# Patient Record
Sex: Male | Born: 1965 | Race: White | Hispanic: No | Marital: Married | Smoking: Never smoker
Health system: Southern US, Community
[De-identification: ages and names within clinical notes are randomized; demographics above are authoritative.]

## PROBLEM LIST (undated history)

## (undated) DIAGNOSIS — I10 Essential (primary) hypertension: Secondary | ICD-10-CM

## (undated) DIAGNOSIS — C187 Malignant neoplasm of sigmoid colon: Secondary | ICD-10-CM

## (undated) DIAGNOSIS — R7301 Impaired fasting glucose: Secondary | ICD-10-CM

## (undated) DIAGNOSIS — E785 Hyperlipidemia, unspecified: Secondary | ICD-10-CM

## (undated) DIAGNOSIS — M109 Gout, unspecified: Secondary | ICD-10-CM

## (undated) DIAGNOSIS — C801 Malignant (primary) neoplasm, unspecified: Secondary | ICD-10-CM

## (undated) HISTORY — PX: APPENDECTOMY: SHX54

## (undated) HISTORY — DX: Gout, unspecified: M10.9

## (undated) HISTORY — DX: Hyperlipidemia, unspecified: E78.5

## (undated) HISTORY — DX: Impaired fasting glucose: R73.01

## (undated) HISTORY — DX: Malignant neoplasm of sigmoid colon: C18.7

---

## 2012-10-04 ENCOUNTER — Telehealth: Payer: Self-pay | Admitting: Family Medicine

## 2012-10-04 NOTE — Telephone Encounter (Signed)
Patient requesting blood work orders, has follow up appt 10/17/12

## 2012-10-05 ENCOUNTER — Other Ambulatory Visit: Payer: Self-pay

## 2012-10-05 DIAGNOSIS — R7301 Impaired fasting glucose: Secondary | ICD-10-CM

## 2012-10-05 DIAGNOSIS — E785 Hyperlipidemia, unspecified: Secondary | ICD-10-CM

## 2012-10-05 DIAGNOSIS — Z79899 Other long term (current) drug therapy: Secondary | ICD-10-CM

## 2012-10-05 NOTE — Telephone Encounter (Signed)
Fasting lipid liver and glucose. Thanks

## 2012-10-05 NOTE — Telephone Encounter (Signed)
Left message on voicemail notifying patient that blood work has been submitted to [facility].

## 2012-10-10 ENCOUNTER — Other Ambulatory Visit: Payer: Self-pay | Admitting: *Deleted

## 2012-10-10 DIAGNOSIS — E785 Hyperlipidemia, unspecified: Secondary | ICD-10-CM

## 2012-10-10 DIAGNOSIS — Z79899 Other long term (current) drug therapy: Secondary | ICD-10-CM

## 2012-10-10 DIAGNOSIS — R7301 Impaired fasting glucose: Secondary | ICD-10-CM

## 2012-10-11 LAB — LIPID PANEL
HDL: 39 mg/dL — ABNORMAL LOW (ref >39)
Total CHOL/HDL Ratio: 4.5 Ratio
Triglycerides: 239 mg/dL — ABNORMAL HIGH (ref <150)

## 2012-10-11 LAB — HEPATIC FUNCTION PANEL
ALT: 36 U/L (ref 0–53)
AST: 23 U/L (ref 0–37)
Albumin: 4.4 g/dL (ref 3.5–5.2)
Alkaline Phosphatase: 71 U/L (ref 39–117)
Total Protein: 6.7 g/dL (ref 6.0–8.3)

## 2012-10-11 LAB — GLUCOSE, RANDOM: Glucose, Bld: 112 mg/dL — ABNORMAL HIGH (ref 70–99)

## 2012-10-12 ENCOUNTER — Encounter: Payer: Self-pay | Admitting: *Deleted

## 2012-10-17 ENCOUNTER — Ambulatory Visit (INDEPENDENT_AMBULATORY_CARE_PROVIDER_SITE_OTHER): Payer: BC Managed Care – PPO | Admitting: Family Medicine

## 2012-10-17 ENCOUNTER — Encounter: Payer: Self-pay | Admitting: Family Medicine

## 2012-10-17 VITALS — BP 118/86 | HR 80 | Wt 198.8 lb

## 2012-10-17 DIAGNOSIS — R7301 Impaired fasting glucose: Secondary | ICD-10-CM

## 2012-10-17 DIAGNOSIS — E785 Hyperlipidemia, unspecified: Secondary | ICD-10-CM

## 2012-10-17 MED ORDER — SIMVASTATIN 20 MG PO TABS: 20.0000 mg | ORAL_TABLET | Freq: Every evening | ORAL | Status: DC

## 2012-10-17 NOTE — Patient Instructions (Signed)
Try to cut down sugars in your diet.

## 2012-10-17 NOTE — Progress Notes (Signed)
  Subjective:    Patient ID: Scott Meyers, male    DOB: June 10, 1966, 47 y.o.   MRN: [id number]  Hyperlipidemia This is a chronic problem. The problem is controlled. Recent lipid tests were reviewed and are high. Exacerbating diseases include chronic renal disease. There are no known factors aggravating his hyperlipidemia. Pertinent negatives include no chest pain or focal weakness. Current antihyperlipidemic treatment includes exercise. The current treatment provides significant improvement of lipids. There are no compliance problems.     Also hx of imp fasting glu. Admits to eating high glu meals  Review of Systems  Cardiovascular: Negative for chest pain.  Neurological: Negative for focal weakness.  otherwise neg     Objective:   Physical Exam  Vitals reviewed. Constitutional: He appears well-developed and well-nourished.  HENT:  Head: Normocephalic and atraumatic.  Eyes: Pupils are equal, round, and reactive to light.  Neck: Normal range of motion. Neck supple.  Cardiovascular: Normal rate and regular rhythm.   Pulmonary/Chest: Effort normal.          Assessment & Plan:  Hyperlip. ldl good. trigly subopt--discussed. #2glu intol-disc Plan cst, work hard on diet and exercise

## 2013-01-04 ENCOUNTER — Encounter: Payer: Self-pay | Admitting: Family Medicine

## 2013-01-04 ENCOUNTER — Ambulatory Visit (INDEPENDENT_AMBULATORY_CARE_PROVIDER_SITE_OTHER): Payer: BC Managed Care – PPO | Admitting: Family Medicine

## 2013-01-04 VITALS — BP 130/98 | Temp 98.5°F | Wt 197.0 lb

## 2013-01-04 DIAGNOSIS — B351 Tinea unguium: Secondary | ICD-10-CM | POA: Insufficient documentation

## 2013-01-04 DIAGNOSIS — M779 Enthesopathy, unspecified: Secondary | ICD-10-CM

## 2013-01-04 MED ORDER — PREDNISONE 20 MG PO TABS: ORAL_TABLET | ORAL | Status: DC

## 2013-01-04 NOTE — Progress Notes (Signed)
  Subjective:    Patient ID: Scott Meyers, male    DOB: 31-Mar-1966, 47 y.o.   MRN: [id number]  HPI  Toe pain--prety severe after kicking dog. Turned blue with the injury. Now extremely painful. Particularly the top part of the toe. Prior medical history significant for gout.  Patient also concerned about probable fungus under great toenail on left side. No pain in the toe. It is breaking off at times. Review of Systems No pain elsewhere no chest pain no shortness of breath ROS otherwise negative.    Objective:   Physical Exam  Alert no acute distress. Lungs clear. Heart regular in rhythm. Right dorsal toe swollen exquisitely tender. No tenderness at first metatarsophalangeal joint. Significant fungal element under left great toenail.      Assessment & Plan:  Impression 1 first toe discussed. Nature of injury discussed. #2 onychomycosis. Recommend no therapy at this time. Potential lethal side effects of medication discussed. Local measures discussed. WSL prednisone taper.

## 2013-01-20 ENCOUNTER — Telehealth: Payer: Self-pay | Admitting: Family Medicine

## 2013-01-20 NOTE — Telephone Encounter (Signed)
May refill prednisone times one

## 2013-01-20 NOTE — Telephone Encounter (Signed)
Called in refill of prednisone to [facility]. Patient was notified.

## 2013-01-20 NOTE — Telephone Encounter (Signed)
Last office visit was 01/04/13 for issue

## 2013-01-20 NOTE — Telephone Encounter (Signed)
Patient needs another round of prednisone called in for his toe. He says its hurting again. He uses [facility].

## 2013-06-05 ENCOUNTER — Other Ambulatory Visit: Payer: Self-pay | Admitting: Family Medicine

## 2013-09-11 ENCOUNTER — Ambulatory Visit (INDEPENDENT_AMBULATORY_CARE_PROVIDER_SITE_OTHER): Payer: BC Managed Care – PPO | Admitting: Family Medicine

## 2013-09-11 ENCOUNTER — Encounter: Payer: Self-pay | Admitting: Family Medicine

## 2013-09-11 VITALS — BP 110/82 | Ht 68.0 in | Wt 206.2 lb

## 2013-09-11 DIAGNOSIS — Z79899 Other long term (current) drug therapy: Secondary | ICD-10-CM

## 2013-09-11 DIAGNOSIS — E785 Hyperlipidemia, unspecified: Secondary | ICD-10-CM

## 2013-09-11 DIAGNOSIS — Z0189 Encounter for other specified special examinations: Secondary | ICD-10-CM

## 2013-09-11 MED ORDER — SIMVASTATIN 20 MG PO TABS: 20.0000 mg | ORAL_TABLET | Freq: Every day | ORAL | Status: DC

## 2013-09-11 MED ORDER — PREDNISONE 20 MG PO TABS: ORAL_TABLET | ORAL | Status: DC

## 2013-09-11 NOTE — Progress Notes (Signed)
   Subjective:    Patient ID: Scott Meyers, male    DOB: 08-31-1965, 48 y.o.   MRN: [id number]  Toe Pain  The incident occurred 5 to 7 days ago. The injury mechanism was a direct blow. The pain is present in the right toes. The pain is at a severity of 9/10. The pain is moderate. The pain has been constant since onset. He reports no foreign bodies present. The symptoms are aggravated by movement. He has tried NSAIDs and non-weight bearing for the symptoms. The treatment provided mild relief.   Patient claims compliance with her lipid medication. Trying to watch his diet. Has cut the fats down his diet. Exercising a fair amount. Next.  History of elevated sugar. Has tried to cut down his diet.  The last time he injured his toe like this he required prednisone calm down the pain. Quite painful with motion. Took to heavy rock that was frozen in ground   Review of Systems Headache no chest pain no back pain no change in bowel habits ROS otherwise negative    Objective:   Physical Exam  Alert no acute distress. Vital stable. Lungs clear. Heart regular in rhythm. Right great toe tenderness warmth at base of the foot.      Assessment & Plan:  Impression 1 hyperlipidemia status uncertain #2 impaired fasting glucose. #3 "turf toe" plan prednisone taper. Check blood work. Diet exercise discussed. Check every 6 months. WSL

## 2013-10-05 ENCOUNTER — Other Ambulatory Visit: Payer: Self-pay | Admitting: Family Medicine

## 2013-10-05 NOTE — Telephone Encounter (Signed)
Pt seen on 09/11/13

## 2013-10-23 ENCOUNTER — Other Ambulatory Visit: Payer: Self-pay | Admitting: Family Medicine

## 2013-10-25 LAB — HEPATIC FUNCTION PANEL
ALBUMIN: 4 g/dL (ref 3.5–5.2)
ALT: 34 U/L (ref 0–53)
AST: 26 U/L (ref 0–37)
Alkaline Phosphatase: 67 U/L (ref 39–117)
Bilirubin, Direct: 0.2 mg/dL (ref 0.0–0.3)
Indirect Bilirubin: 0.7 mg/dL (ref 0.2–1.2)
TOTAL PROTEIN: 6.4 g/dL (ref 6.0–8.3)
Total Bilirubin: 0.9 mg/dL (ref 0.2–1.2)

## 2013-10-25 LAB — LIPID PANEL
CHOL/HDL RATIO: 4.2 ratio
CHOLESTEROL: 189 mg/dL (ref 0–200)
HDL: 45 mg/dL (ref >39)
LDL Cholesterol: 119 mg/dL — ABNORMAL HIGH (ref 0–99)
TRIGLYCERIDES: 123 mg/dL (ref <150)
VLDL: 25 mg/dL (ref 0–40)

## 2013-10-25 LAB — GLUCOSE, RANDOM: Glucose, Bld: 97 mg/dL (ref 70–99)

## 2013-10-31 ENCOUNTER — Encounter: Payer: Self-pay | Admitting: Family Medicine

## 2013-10-31 ENCOUNTER — Ambulatory Visit (INDEPENDENT_AMBULATORY_CARE_PROVIDER_SITE_OTHER): Payer: BC Managed Care – PPO | Admitting: Family Medicine

## 2013-10-31 VITALS — BP 142/90 | Ht 68.0 in | Wt 202.0 lb

## 2013-10-31 DIAGNOSIS — E785 Hyperlipidemia, unspecified: Secondary | ICD-10-CM

## 2013-10-31 DIAGNOSIS — R7301 Impaired fasting glucose: Secondary | ICD-10-CM

## 2013-10-31 MED ORDER — PREDNISONE 20 MG PO TABS: ORAL_TABLET | ORAL | Status: DC

## 2013-10-31 NOTE — Progress Notes (Signed)
   Subjective:    Patient ID: Scott Meyers, male    DOB: 04-08-1966, 48 y.o.   MRN: [id number]  HPI Patient is here today to go over BW results.  Pt said his foot/toe is doing better with the help of the prednisone.   Exercise ok but not great  Compliant with her lipid medication. Please see prior note. We had obtained his blood work. I had advised him out of a be able to check blood work not him come back in. However patient was told to come back in.  No chest pain no headache back pain Review of Systems No bile abdominal pain no change about habits no blood in stool ROS otherwise negative    Objective:   Physical Exam Alert no apparent distress. Lungs clear. Heart rare rhythm. H&T normal. Great toe less inflammation. Blood pressure good.       Assessment & Plan:  Impression 1 hyperlipidemia good control discussed. #2 recent contusion of foot question turf toe with all meds of gout. Plan refill prednisone this in case. Maintain other medications. Diet exercise discussed. WSL

## 2014-03-15 ENCOUNTER — Telehealth: Payer: Self-pay | Admitting: Family Medicine

## 2014-03-15 DIAGNOSIS — Z125 Encounter for screening for malignant neoplasm of prostate: Secondary | ICD-10-CM

## 2014-03-15 DIAGNOSIS — E785 Hyperlipidemia, unspecified: Secondary | ICD-10-CM

## 2014-03-15 DIAGNOSIS — Z79899 Other long term (current) drug therapy: Secondary | ICD-10-CM

## 2014-03-15 NOTE — Telephone Encounter (Signed)
Patient needs order for blood work. For PSA and cholestrol.

## 2014-03-15 NOTE — Telephone Encounter (Signed)
psa lip liv m7 

## 2014-03-15 NOTE — Telephone Encounter (Signed)
Lipid, liver and glucose done 4/15

## 2014-03-16 NOTE — Telephone Encounter (Signed)
Blood work orders placed in [facility]. Patient notified. 

## 2014-05-01 ENCOUNTER — Encounter: Payer: BC Managed Care – PPO | Admitting: Family Medicine

## 2014-05-02 ENCOUNTER — Ambulatory Visit: Payer: BC Managed Care – PPO | Admitting: Family Medicine

## 2014-05-12 ENCOUNTER — Other Ambulatory Visit: Payer: Self-pay | Admitting: Family Medicine

## 2014-05-13 LAB — HEPATIC FUNCTION PANEL
ALT: 52 U/L (ref 0–53)
AST: 31 U/L (ref 0–37)
Albumin: 4.2 g/dL (ref 3.5–5.2)
Alkaline Phosphatase: 70 U/L (ref 39–117)
BILIRUBIN DIRECT: 0.1 mg/dL (ref 0.0–0.3)
Indirect Bilirubin: 0.7 mg/dL (ref 0.2–1.2)
TOTAL PROTEIN: 6.6 g/dL (ref 6.0–8.3)
Total Bilirubin: 0.8 mg/dL (ref 0.2–1.2)

## 2014-05-13 LAB — BASIC METABOLIC PANEL
BUN: 12 mg/dL (ref 6–23)
CHLORIDE: 104 meq/L (ref 96–112)
CO2: 26 mEq/L (ref 19–32)
Calcium: 9 mg/dL (ref 8.4–10.5)
Creat: 1 mg/dL (ref 0.50–1.35)
GLUCOSE: 99 mg/dL (ref 70–99)
POTASSIUM: 4.4 meq/L (ref 3.5–5.3)
SODIUM: 140 meq/L (ref 135–145)

## 2014-05-13 LAB — LIPID PANEL
CHOL/HDL RATIO: 4.8 ratio
CHOLESTEROL: 216 mg/dL — AB (ref 0–200)
HDL: 45 mg/dL (ref >39)
LDL CALC: 145 mg/dL — AB (ref 0–99)
Triglycerides: 128 mg/dL (ref <150)
VLDL: 26 mg/dL (ref 0–40)

## 2014-05-14 ENCOUNTER — Encounter: Payer: Self-pay | Admitting: Family Medicine

## 2014-05-14 ENCOUNTER — Ambulatory Visit (INDEPENDENT_AMBULATORY_CARE_PROVIDER_SITE_OTHER): Payer: BC Managed Care – PPO | Admitting: Family Medicine

## 2014-05-14 VITALS — BP 138/94 | Ht 68.0 in | Wt 196.0 lb

## 2014-05-14 DIAGNOSIS — Z0000 Encounter for general adult medical examination without abnormal findings: Secondary | ICD-10-CM

## 2014-05-14 LAB — PSA: PSA: 1.11 ng/mL (ref <=4.00)

## 2014-05-14 MED ORDER — SIMVASTATIN 20 MG PO TABS: 20.0000 mg | ORAL_TABLET | Freq: Every day | ORAL | Status: DC

## 2014-05-14 NOTE — Progress Notes (Signed)
Subjective:    Patient ID: Scott Meyers, male    DOB: April 23, 1966, 48 y.o.   MRN: [id number]  HPI The patient comes in today for a wellness visit.  A review of their health history was completed.  A review of medications was also completed.  Any needed refills: YES  Eating habits: Health conscious  Falls/  MVA accidents in past few months: NO  Regular exercise: Walking  Specialist pt sees on regular basis: NO  Preventative health issues were discussed.   Additional concerns: Would like to go over blood work.   States watching diet and eating better   Results for orders placed or performed in visit on 37/04/88  Basic metabolic panel  Result Value Ref Range   Sodium 140 135 - 145 mEq/L   Potassium 4.4 3.5 - 5.3 mEq/L   Chloride 104 96 - 112 mEq/L   CO2 26 19 - 32 mEq/L   Glucose, Bld 99 70 - 99 mg/dL   BUN 12 6 - 23 mg/dL   Creat 1.00 0.50 - 1.35 mg/dL   Calcium 9.0 8.4 - 10.5 mg/dL  Lipid panel  Result Value Ref Range   Cholesterol 216 (H) 0 - 200 mg/dL   Triglycerides 128 <150 mg/dL   HDL 45 >39 mg/dL   Total CHOL/HDL Ratio 4.8 Ratio   VLDL 26 0 - 40 mg/dL   LDL Cholesterol 145 (H) 0 - 99 mg/dL  Hepatic function panel  Result Value Ref Range   Total Bilirubin 0.8 0.2 - 1.2 mg/dL   Bilirubin, Direct 0.1 0.0 - 0.3 mg/dL   Indirect Bilirubin 0.7 0.2 - 1.2 mg/dL   Alkaline Phosphatase 70 39 - 117 U/L   AST 31 0 - 37 U/L   ALT 52 0 - 53 U/L   Total Protein 6.6 6.0 - 8.3 g/dL   Albumin 4.2 3.5 - 5.2 g/dL  PSA  Result Value Ref Range   PSA  <=4.00 ng/mL  trying to eat better but some baked and fried foods   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  HENT: Negative for congestion and rhinorrhea.   Eyes: Negative for discharge.  Respiratory: Negative for cough and wheezing.   Cardiovascular: Negative for chest pain.    Gastrointestinal: Negative for vomiting, abdominal pain and blood in stool.  Genitourinary: Negative for frequency and difficulty urinating.  Musculoskeletal: Negative for neck pain.  Skin: Negative for rash.  Allergic/Immunologic: Negative for environmental allergies and food allergies.  Neurological: Negative for weakness and headaches.  Psychiatric/Behavioral: Negative for agitation.  All other systems reviewed and are negative.      Objective:   Physical Exam  Constitutional: He appears well-developed and well-nourished.  HENT:  Head: Normocephalic and atraumatic.  Right Ear: External ear normal.  Left Ear: External ear normal.  Nose: Nose normal.  Mouth/Throat: Oropharynx is clear and moist.  Eyes: EOM are normal. Pupils are equal, round, and reactive to light.  Neck: Normal range of motion. Neck supple. No thyromegaly present.  Cardiovascular: Normal rate, regular rhythm and normal heart sounds.   No murmur heard. Pulmonary/Chest: Effort normal and breath sounds normal. No respiratory distress. He has no wheezes.  Abdominal: Soft. Bowel sounds are normal. He exhibits no distension and no mass. There is no tenderness.  Genitourinary: Prostate normal and penis normal.  Musculoskeletal:  Normal range of motion. He exhibits no edema.  Lymphadenopathy:    He has no cervical adenopathy.  Neurological: He is alert. He exhibits normal muscle tone.  Skin: Skin is warm and dry. No erythema.  Psychiatric: He has a normal mood and affect. His behavior is normal. Judgment normal.          Assessment & Plan:  Impression 1 wellness exam #2 hyperlipidemia so off to mom but with suboptimal compliance both with medications and diet plan diet exercise discussed. Declines flu shot. Check in 6 months. WSL

## 2014-05-20 ENCOUNTER — Encounter: Payer: Self-pay | Admitting: Family Medicine

## 2014-07-11 ENCOUNTER — Other Ambulatory Visit: Payer: Self-pay | Admitting: Family Medicine

## 2014-07-11 MED ORDER — PREDNISONE 20 MG PO TABS: ORAL_TABLET | ORAL | Status: DC

## 2014-07-11 NOTE — Telephone Encounter (Signed)
Notified patient medication has been sent to pharmacy.

## 2014-07-11 NOTE — Addendum Note (Signed)
Addended by: Jesusita Oka on: 07/11/2014 03:38 PM   Modules accepted: Orders

## 2014-07-11 NOTE — Telephone Encounter (Signed)
Patient calling for refill on prednisone for swollen foot.He states couldn't sleep last night so swollen to the touch. Call into [facility].

## 2014-07-11 NOTE — Telephone Encounter (Signed)
Adult pred taper 

## 2014-11-05 ENCOUNTER — Telehealth: Payer: Self-pay | Admitting: Family Medicine

## 2014-11-05 DIAGNOSIS — Z79899 Other long term (current) drug therapy: Secondary | ICD-10-CM

## 2014-11-05 DIAGNOSIS — E785 Hyperlipidemia, unspecified: Secondary | ICD-10-CM

## 2014-11-05 DIAGNOSIS — R7301 Impaired fasting glucose: Secondary | ICD-10-CM

## 2014-11-05 NOTE — Telephone Encounter (Signed)
Pt is requesting lab orders to be sent over for his 5/2 appt. Last labs per epic were: BMP,LIPID,HEPATIC, AND PSA on 05/12/14.

## 2014-11-06 NOTE — Telephone Encounter (Signed)
bw orders ready. Pt notified. 

## 2014-11-06 NOTE — Telephone Encounter (Signed)
Lip liv glu 

## 2014-11-10 LAB — LIPID PANEL
CHOLESTEROL TOTAL: 199 mg/dL (ref 100–199)
Chol/HDL Ratio: 4.1 ratio units (ref 0.0–5.0)
HDL: 49 mg/dL (ref >39)
LDL Calculated: 124 mg/dL — ABNORMAL HIGH (ref 0–99)
TRIGLYCERIDES: 132 mg/dL (ref 0–149)
VLDL CHOLESTEROL CAL: 26 mg/dL (ref 5–40)

## 2014-11-10 LAB — HEPATIC FUNCTION PANEL
ALK PHOS: 78 IU/L (ref 39–117)
ALT: 45 IU/L — ABNORMAL HIGH (ref 0–44)
AST: 32 IU/L (ref 0–40)
Albumin: 4.5 g/dL (ref 3.5–5.5)
BILIRUBIN TOTAL: 0.8 mg/dL (ref 0.0–1.2)
Bilirubin, Direct: 0.19 mg/dL (ref 0.00–0.40)
TOTAL PROTEIN: 6.6 g/dL (ref 6.0–8.5)

## 2014-11-10 LAB — GLUCOSE, RANDOM: Glucose: 112 mg/dL — ABNORMAL HIGH (ref 65–99)

## 2014-11-12 ENCOUNTER — Ambulatory Visit (INDEPENDENT_AMBULATORY_CARE_PROVIDER_SITE_OTHER): Payer: BC Managed Care – PPO | Admitting: Family Medicine

## 2014-11-12 ENCOUNTER — Encounter: Payer: Self-pay | Admitting: Family Medicine

## 2014-11-12 VITALS — BP 138/98 | Ht 68.0 in | Wt 194.0 lb

## 2014-11-12 DIAGNOSIS — R739 Hyperglycemia, unspecified: Secondary | ICD-10-CM

## 2014-11-12 DIAGNOSIS — R7301 Impaired fasting glucose: Secondary | ICD-10-CM | POA: Diagnosis not present

## 2014-11-12 DIAGNOSIS — E785 Hyperlipidemia, unspecified: Secondary | ICD-10-CM

## 2014-11-12 LAB — POCT GLYCOSYLATED HEMOGLOBIN (HGB A1C): Hemoglobin A1C: 5.6

## 2014-11-12 MED ORDER — SIMVASTATIN 20 MG PO TABS: 20.0000 mg | ORAL_TABLET | Freq: Every day | ORAL | Status: DC

## 2014-11-12 NOTE — Progress Notes (Signed)
   Subjective:    Patient ID: Scott Meyers, male    DOB: [birth year], 49 y.o.   MRN: [id number]  Hyperlipidemia This is a chronic problem. Treatments tried: pravastatin. There are no compliance problems.   pt walks some and he is eating healthier.    Pt had bloodwork done 4/29. Glucose was 112. A1C done today. 5.6  Needs 90 day supply on pravastatin.   Results for orders placed or performed in visit on 11/12/14  POCT glycosylated hemoglobin (Hb A1C)  Result Value Ref Range   Hemoglobin A1C 5.6    Patient has no close family histories with diabetes. Trying to watch his own diet. Unfortunately not exercising other than occasional walking  Review of Systems No headache no chest pain no back pain no abdominal pain    Objective:   Physical Exam Alert vitals stable blood pressure good on repeat H&T normal. Lungs clear heart regular in rhythm.       Assessment & Plan:  Impression 1 hyperlipidemia good control #2 impaired fasting glucose discussed plan work on diet. Exercise diet discussed. Maintain same medications. Check in 6 months. Wellness exam then. WSL

## 2015-03-15 ENCOUNTER — Telehealth: Payer: Self-pay | Admitting: Family Medicine

## 2015-03-15 DIAGNOSIS — Z125 Encounter for screening for malignant neoplasm of prostate: Secondary | ICD-10-CM

## 2015-03-15 DIAGNOSIS — E785 Hyperlipidemia, unspecified: Secondary | ICD-10-CM

## 2015-03-15 DIAGNOSIS — Z79899 Other long term (current) drug therapy: Secondary | ICD-10-CM

## 2015-03-15 NOTE — Telephone Encounter (Signed)
Pt has appt coming needs bw orders please, call when sent   Last labs  Lip, Hep, Glucose random

## 2015-03-15 NOTE — Telephone Encounter (Signed)
Lip liv m7 psa 

## 2015-03-15 NOTE — Telephone Encounter (Signed)
bw orders ready. Pt notified. 

## 2015-05-10 ENCOUNTER — Encounter: Payer: BC Managed Care – PPO | Admitting: Family Medicine

## 2015-05-13 LAB — BASIC METABOLIC PANEL
BUN/Creatinine Ratio: 9 (ref 9–20)
BUN: 10 mg/dL (ref 6–24)
CHLORIDE: 99 mmol/L (ref 97–106)
CO2: 24 mmol/L (ref 18–29)
Calcium: 9.3 mg/dL (ref 8.7–10.2)
Creatinine, Ser: 1.12 mg/dL (ref 0.76–1.27)
GFR calc Af Amer: 89 mL/min/{1.73_m2} (ref >59)
GFR calc non Af Amer: 77 mL/min/{1.73_m2} (ref >59)
GLUCOSE: 111 mg/dL — AB (ref 65–99)
POTASSIUM: 4.8 mmol/L (ref 3.5–5.2)
Sodium: 143 mmol/L (ref 136–144)

## 2015-05-13 LAB — HEPATIC FUNCTION PANEL
ALK PHOS: 79 IU/L (ref 39–117)
ALT: 68 IU/L — AB (ref 0–44)
AST: 36 IU/L (ref 0–40)
Albumin: 4.4 g/dL (ref 3.5–5.5)
Bilirubin Total: 0.7 mg/dL (ref 0.0–1.2)
Bilirubin, Direct: 0.2 mg/dL (ref 0.00–0.40)
TOTAL PROTEIN: 6.5 g/dL (ref 6.0–8.5)

## 2015-05-13 LAB — LIPID PANEL
Chol/HDL Ratio: 4.7 ratio units (ref 0.0–5.0)
Cholesterol, Total: 213 mg/dL — ABNORMAL HIGH (ref 100–199)
HDL: 45 mg/dL (ref >39)
LDL Calculated: 136 mg/dL — ABNORMAL HIGH (ref 0–99)
TRIGLYCERIDES: 158 mg/dL — AB (ref 0–149)
VLDL CHOLESTEROL CAL: 32 mg/dL (ref 5–40)

## 2015-05-13 LAB — PSA: Prostate Specific Ag, Serum: 1.3 ng/mL (ref 0.0–4.0)

## 2015-05-24 ENCOUNTER — Ambulatory Visit (INDEPENDENT_AMBULATORY_CARE_PROVIDER_SITE_OTHER): Payer: BC Managed Care – PPO | Admitting: Family Medicine

## 2015-05-24 ENCOUNTER — Encounter: Payer: Self-pay | Admitting: Family Medicine

## 2015-05-24 VITALS — BP 132/90 | Ht 68.0 in | Wt 200.2 lb

## 2015-05-24 DIAGNOSIS — R7301 Impaired fasting glucose: Secondary | ICD-10-CM

## 2015-05-24 DIAGNOSIS — E785 Hyperlipidemia, unspecified: Secondary | ICD-10-CM | POA: Diagnosis not present

## 2015-05-24 DIAGNOSIS — Z0000 Encounter for general adult medical examination without abnormal findings: Secondary | ICD-10-CM

## 2015-05-24 MED ORDER — SIMVASTATIN 20 MG PO TABS: 20.0000 mg | ORAL_TABLET | Freq: Every day | ORAL | Status: DC

## 2015-05-24 NOTE — Progress Notes (Signed)
Subjective:    Patient ID: Scott Meyers, male    DOB: Aug 19, 1965, 49 y.o.   MRN: [id number]  HPI The patient comes in today for a wellness visit.    A review of their health history was completed.  A review of medications was also completed.  Any needed refills; yes  Eating habits: Patient states trying to cut back on carbohydrates. Tries to eat well balanced.  Falls/  MVA accidents in past few months: None  Regular exercise: Patient states he walks several times a week. States he walks at slow pace.  Specialist pt sees on regular basis: None  Preventative health issues were discussed.   Additional concerns: Patient states no concerns this visit.  Results for orders placed or performed in visit on 03/15/15  Lipid panel  Result Value Ref Range   Cholesterol, Total 213 (H) 100 - 199 mg/dL   Triglycerides 158 (H) 0 - 149 mg/dL   HDL 45 >39 mg/dL   VLDL Cholesterol Cal 32 5 - 40 mg/dL   LDL Calculated 136 (H) 0 - 99 mg/dL   Chol/HDL Ratio 4.7 0.0 - 5.0 ratio units  Hepatic function panel  Result Value Ref Range   Total Protein 6.5 6.0 - 8.5 g/dL   Albumin 4.4 3.5 - 5.5 g/dL   Bilirubin Total 0.7 0.0 - 1.2 mg/dL   Bilirubin, Direct 0.20 0.00 - 0.40 mg/dL   Alkaline Phosphatase 79 39 - 117 IU/L   AST 36 0 - 40 IU/L   ALT 68 (H) 0 - 44 IU/L  Basic metabolic panel  Result Value Ref Range   Glucose 111 (H) 65 - 99 mg/dL   BUN 10 6 - 24 mg/dL   Creatinine, Ser 1.12 0.76 - 1.27 mg/dL   GFR calc non Af Amer 77 >59 mL/min/1.73   GFR calc Af Amer 89 >59 mL/min/1.73   BUN/Creatinine Ratio 9 9 - 20   Sodium 143 136 - 144 mmol/L   Potassium 4.8 3.5 - 5.2 mmol/L   Chloride 99 97 - 106 mmol/L   CO2 24 18 - 29 mmol/L   Calcium 9.3 8.7 - 10.2 mg/dL  PSA  Result Value Ref Range   Prostate Specific Ag, Serum 1.3 0.0 - 4.0 ng/mL    Pt had liver  c a hx with the family  Pt states diet overall i  Patient compliant with cholesterol medication. No obvious side effects.  Medications reviewed today. Watching fat intake overall.    Review of Systems  Constitutional: Negative for fever, activity change and appetite change.  HENT: Negative for congestion and rhinorrhea.   Eyes: Negative for discharge.  Respiratory: Negative for cough and wheezing.   Cardiovascular: Negative for chest pain.  Gastrointestinal: Negative for vomiting, abdominal pain and blood in stool.  Genitourinary: Negative for frequency and difficulty urinating.  Musculoskeletal: Negative for neck pain.  Skin: Negative for rash.  Allergic/Immunologic: Negative for environmental allergies and food allergies.  Neurological: Negative for weakness and headaches.  Psychiatric/Behavioral: Negative for agitation.  All other systems reviewed and are negative.      Objective:   Physical Exam  Constitutional: He appears well-developed and well-nourished.  HENT:  Head: Normocephalic and atraumatic.  Right Ear: External ear normal.  Left Ear: External ear normal.  Nose: Nose normal.  Mouth/Throat: Oropharynx is clear and moist.  Eyes: EOM are normal. Pupils are equal, round, and reactive to light.  Neck: Normal range of motion. Neck supple. No thyromegaly present.  Cardiovascular: Normal  rate, regular rhythm and normal heart sounds.   No murmur heard. Pulmonary/Chest: Effort normal and breath sounds normal. No respiratory distress. He has no wheezes.  Abdominal: Soft. Bowel sounds are normal. He exhibits no distension and no mass. There is no tenderness.  Genitourinary: Penis normal.  Musculoskeletal: Normal range of motion. He exhibits no edema.  Lymphadenopathy:    He has no cervical adenopathy.  Neurological: He is alert. He exhibits normal muscle tone.  Skin: Skin is warm and dry. No erythema.  Psychiatric: He has a normal mood and affect. His behavior is normal. Judgment normal.  Vitals reviewed.         Assessment & Plan:  Impression 1 wellness exam #2 hyperlipidemia discuss  medications reviewed diet. #3 impaired fasting glucose discussed plan maintain same meds meds refilled diet exercise discussed blood work reviewed follow-up in 6 months WSL

## 2015-09-20 ENCOUNTER — Encounter: Payer: Self-pay | Admitting: Podiatry

## 2015-09-20 ENCOUNTER — Ambulatory Visit (INDEPENDENT_AMBULATORY_CARE_PROVIDER_SITE_OTHER): Payer: BC Managed Care – PPO | Admitting: Podiatry

## 2015-09-20 ENCOUNTER — Ambulatory Visit (INDEPENDENT_AMBULATORY_CARE_PROVIDER_SITE_OTHER): Payer: BC Managed Care – PPO

## 2015-09-20 VITALS — BP 152/101 | HR 70 | Resp 16 | Ht 68.0 in | Wt 195.0 lb

## 2015-09-20 DIAGNOSIS — M79672 Pain in left foot: Secondary | ICD-10-CM | POA: Diagnosis not present

## 2015-09-20 DIAGNOSIS — M722 Plantar fascial fibromatosis: Secondary | ICD-10-CM | POA: Diagnosis not present

## 2015-09-20 MED ORDER — DICLOFENAC SODIUM 75 MG PO TBEC: 75.0000 mg | DELAYED_RELEASE_TABLET | Freq: Two times a day (BID) | ORAL | Status: DC

## 2015-09-20 MED ORDER — TRIAMCINOLONE ACETONIDE 10 MG/ML IJ SUSP
10.0000 mg | Freq: Once | INTRAMUSCULAR | Status: AC
  Administered 2015-09-20: 10 mg

## 2015-09-20 NOTE — Progress Notes (Signed)
   Subjective:    Patient ID: Scott Meyers, male    DOB: 09-21-1965, 50 y.o.   MRN: [id number]  HPI Patient presents with foot pain in their left foot; heel. Pt stated, "foot hurts more in the morning"; x1 month.  Review of Systems  All other systems reviewed and are negative.      Objective:   Physical Exam        Assessment & Plan:

## 2015-09-20 NOTE — Patient Instructions (Signed)

## 2015-09-20 NOTE — Progress Notes (Signed)
Subjective:     Patient ID: Scott Meyers, male   DOB: 10/20/1965, 50 y.o.   MRN: [id number]  HPI patient presents stating I'm having a lot of pain in the bottom of my left heel and it's making walking difficult. It's been very sharp and is been worse for the last month   Review of Systems  All other systems reviewed and are negative.      Objective:   Physical Exam  Constitutional: He is oriented to person, place, and time.  Cardiovascular: Intact distal pulses.   Musculoskeletal: Normal range of motion.  Neurological: He is oriented to person, place, and time.  Skin: Skin is warm.  Nursing note and vitals reviewed.  neurovascular status intact muscle strength adequate range of motion within normal limits with patient found to have exquisite discomfort in the left plantar heel at the insertional point of the tendon into the calcaneus with fluid buildup around the medial band. Patient is found to have moderate depression of the arch good digital perfusion and is well oriented 3     Assessment:     Acute plantar fasciitis left with fluid buildup noted    Plan:     H&P and x-rays reviewed with patient. Today I injected the left plantar fascia 3 mg Kenalog 5 mg Xylocaine and gave extensive instructions on physical therapy shoe gear modifications and not going barefoot and dispensed fascial brace with instructions on usage. Reappoint to recheck again in 2 weeks  X-ray report indicated spur formation with no indications of stress fracture or arthritis

## 2015-10-04 ENCOUNTER — Ambulatory Visit (INDEPENDENT_AMBULATORY_CARE_PROVIDER_SITE_OTHER): Payer: BC Managed Care – PPO | Admitting: Podiatry

## 2015-10-04 ENCOUNTER — Encounter: Payer: Self-pay | Admitting: Podiatry

## 2015-10-04 VITALS — BP 152/101 | HR 68 | Resp 16

## 2015-10-04 DIAGNOSIS — M722 Plantar fascial fibromatosis: Secondary | ICD-10-CM | POA: Diagnosis not present

## 2015-10-06 NOTE — Progress Notes (Signed)
Subjective:     Patient ID: Scott Meyers, male   DOB: [birth year], 50 y.o.   MRN: [id number]  HPI patient states my foot is feeling much better with minimal discomfort and only pain if him on it a lot   Review of Systems     Objective:   Physical Exam Neurovascular status intact muscle strength adequate range of motion within normal limits with patient found to have discomfort upon deep palpation plantar fascial left    Assessment:     Plantar fasciitis which is improving significantly left    Plan:     Reviewed conditions and recommended continued physical therapy supportive shoe gear usage and stretching. If symptoms persist or get worse we will consider orthotics or other treatment

## 2015-11-01 ENCOUNTER — Telehealth: Payer: Self-pay | Admitting: Family Medicine

## 2015-11-01 DIAGNOSIS — Z131 Encounter for screening for diabetes mellitus: Secondary | ICD-10-CM

## 2015-11-01 DIAGNOSIS — Z79899 Other long term (current) drug therapy: Secondary | ICD-10-CM

## 2015-11-01 DIAGNOSIS — E785 Hyperlipidemia, unspecified: Secondary | ICD-10-CM

## 2015-11-01 NOTE — Telephone Encounter (Signed)
Pt is requesting lab orders to be sent over for an upcoming appt. Last labs per epic were: lipid,hepatic,bmp,and psa on 05/11/15

## 2015-11-03 NOTE — Telephone Encounter (Signed)
Lip liv glu 

## 2015-11-04 NOTE — Telephone Encounter (Signed)
Notified patient bloodwork has been ordered.  

## 2015-11-10 LAB — GLUCOSE, RANDOM: GLUCOSE: 111 mg/dL — AB (ref 65–99)

## 2015-11-10 LAB — HEPATIC FUNCTION PANEL
ALT: 58 IU/L — ABNORMAL HIGH (ref 0–44)
AST: 35 IU/L (ref 0–40)
Albumin: 4.6 g/dL (ref 3.5–5.5)
Alkaline Phosphatase: 78 IU/L (ref 39–117)
BILIRUBIN TOTAL: 0.8 mg/dL (ref 0.0–1.2)
Bilirubin, Direct: 0.2 mg/dL (ref 0.00–0.40)
TOTAL PROTEIN: 7.1 g/dL (ref 6.0–8.5)

## 2015-11-10 LAB — LIPID PANEL
CHOLESTEROL TOTAL: 231 mg/dL — AB (ref 100–199)
Chol/HDL Ratio: 4.4 ratio units (ref 0.0–5.0)
HDL: 52 mg/dL (ref >39)
LDL Calculated: 151 mg/dL — ABNORMAL HIGH (ref 0–99)
Triglycerides: 138 mg/dL (ref 0–149)
VLDL Cholesterol Cal: 28 mg/dL (ref 5–40)

## 2015-11-22 ENCOUNTER — Ambulatory Visit (INDEPENDENT_AMBULATORY_CARE_PROVIDER_SITE_OTHER): Payer: BC Managed Care – PPO | Admitting: Family Medicine

## 2015-11-22 ENCOUNTER — Encounter: Payer: Self-pay | Admitting: Family Medicine

## 2015-11-22 VITALS — BP 122/70 | Ht 68.0 in | Wt 203.0 lb

## 2015-11-22 DIAGNOSIS — E785 Hyperlipidemia, unspecified: Secondary | ICD-10-CM

## 2015-11-22 DIAGNOSIS — R7301 Impaired fasting glucose: Secondary | ICD-10-CM

## 2015-11-22 MED ORDER — SIMVASTATIN 20 MG PO TABS: 20.0000 mg | ORAL_TABLET | Freq: Every day | ORAL | Status: DC

## 2015-11-22 NOTE — Progress Notes (Signed)
   Subjective:    Patient ID: Scott Meyers, male    DOB: 01-May-1966, 50 y.o.   MRN: [id number]  Hyperlipidemia This is a chronic problem. The current episode started more than 1 year ago. Treatments tried: simvastatin. Compliance problems: sometimes forgets to take med, does some exercise, walks and rides bike, does not eat healthy.    Results for orders placed or performed in visit on 11/01/15  Lipid panel  Result Value Ref Range   Cholesterol, Total 231 (H) 100 - 199 mg/dL   Triglycerides 138 0 - 149 mg/dL   HDL 52 >39 mg/dL   VLDL Cholesterol Cal 28 5 - 40 mg/dL   LDL Calculated 151 (H) 0 - 99 mg/dL   Chol/HDL Ratio 4.4 0.0 - 5.0 ratio units  Hepatic function panel  Result Value Ref Range   Total Protein 7.1 6.0 - 8.5 g/dL   Albumin 4.6 3.5 - 5.5 g/dL   Bilirubin Total 0.8 0.0 - 1.2 mg/dL   Bilirubin, Direct 0.20 0.00 - 0.40 mg/dL   Alkaline Phosphatase 78 39 - 117 IU/L   AST 35 0 - 40 IU/L   ALT 58 (H) 0 - 44 IU/L  Glucose, random  Result Value Ref Range   Glucose 111 (H) 65 - 99 mg/dL   Patient admits to some dietary noncompliance. Also missing medications of time. Has started exercising more   Pt states no concerns or problems.    Review of Systems No headache, no major weight loss or weight gain, no chest pain no back pain abdominal pain no change in bowel habits complete ROS otherwise negative     Objective:   Physical Exam Alert no apparent distress vital stable HEENT normal lungs clear. Heart regular in rhythm   impression hyperlipidemia suboptimum control but decent discussed #2 impaired fasting glucose ongoing concerns discussed plan medications refilled diet exercise discussed check in 6 months for wellness plus chronic follow-up       Assessment & Plan:

## 2016-05-12 ENCOUNTER — Telehealth: Payer: Self-pay | Admitting: Family Medicine

## 2016-05-12 DIAGNOSIS — Z125 Encounter for screening for malignant neoplasm of prostate: Secondary | ICD-10-CM

## 2016-05-12 DIAGNOSIS — E785 Hyperlipidemia, unspecified: Secondary | ICD-10-CM

## 2016-05-12 DIAGNOSIS — Z79899 Other long term (current) drug therapy: Secondary | ICD-10-CM

## 2016-05-12 NOTE — Telephone Encounter (Signed)
Requesting order for blood work for appointment on 05/29/16.

## 2016-05-12 NOTE — Telephone Encounter (Signed)
bloodwork orders ready. Pt notified.  

## 2016-05-12 NOTE — Telephone Encounter (Signed)
Lip liv m7 psa 

## 2016-05-14 ENCOUNTER — Other Ambulatory Visit: Payer: Self-pay | Admitting: Family Medicine

## 2016-05-14 MED ORDER — SIMVASTATIN 20 MG PO TABS: 20.0000 mg | ORAL_TABLET | Freq: Every day | ORAL | 0 refills | Status: DC

## 2016-05-14 NOTE — Addendum Note (Signed)
Addended by: Ofilia Neas R on: 05/14/2016 10:11 AM   Modules accepted: Orders

## 2016-05-15 ENCOUNTER — Other Ambulatory Visit: Payer: Self-pay | Admitting: Family Medicine

## 2016-05-22 LAB — LIPID PANEL
CHOL/HDL RATIO: 3.8 ratio (ref 0.0–5.0)
Cholesterol, Total: 194 mg/dL (ref 100–199)
HDL: 51 mg/dL (ref >39)
LDL Calculated: 107 mg/dL — ABNORMAL HIGH (ref 0–99)
TRIGLYCERIDES: 179 mg/dL — AB (ref 0–149)
VLDL Cholesterol Cal: 36 mg/dL (ref 5–40)

## 2016-05-22 LAB — HEPATIC FUNCTION PANEL
ALK PHOS: 79 IU/L (ref 39–117)
ALT: 65 IU/L — ABNORMAL HIGH (ref 0–44)
AST: 34 IU/L (ref 0–40)
Albumin: 4.4 g/dL (ref 3.5–5.5)
BILIRUBIN TOTAL: 0.7 mg/dL (ref 0.0–1.2)
BILIRUBIN, DIRECT: 0.17 mg/dL (ref 0.00–0.40)
TOTAL PROTEIN: 7 g/dL (ref 6.0–8.5)

## 2016-05-22 LAB — BASIC METABOLIC PANEL
BUN / CREAT RATIO: 10 (ref 9–20)
BUN: 12 mg/dL (ref 6–24)
CO2: 25 mmol/L (ref 18–29)
CREATININE: 1.2 mg/dL (ref 0.76–1.27)
Calcium: 9.1 mg/dL (ref 8.7–10.2)
Chloride: 99 mmol/L (ref 96–106)
GFR, EST AFRICAN AMERICAN: 81 mL/min/{1.73_m2} (ref >59)
GFR, EST NON AFRICAN AMERICAN: 70 mL/min/{1.73_m2} (ref >59)
Glucose: 109 mg/dL — ABNORMAL HIGH (ref 65–99)
Potassium: 4.6 mmol/L (ref 3.5–5.2)
SODIUM: 141 mmol/L (ref 134–144)

## 2016-05-22 LAB — PSA: Prostate Specific Ag, Serum: 1.3 ng/mL (ref 0.0–4.0)

## 2016-05-29 ENCOUNTER — Encounter: Payer: Self-pay | Admitting: Family Medicine

## 2016-05-29 ENCOUNTER — Ambulatory Visit (INDEPENDENT_AMBULATORY_CARE_PROVIDER_SITE_OTHER): Payer: BC Managed Care – PPO | Admitting: Family Medicine

## 2016-05-29 VITALS — BP 124/90 | Ht 67.75 in | Wt 197.0 lb

## 2016-05-29 DIAGNOSIS — M25552 Pain in left hip: Secondary | ICD-10-CM | POA: Diagnosis not present

## 2016-05-29 DIAGNOSIS — Z0000 Encounter for general adult medical examination without abnormal findings: Secondary | ICD-10-CM | POA: Diagnosis not present

## 2016-05-29 DIAGNOSIS — E785 Hyperlipidemia, unspecified: Secondary | ICD-10-CM | POA: Diagnosis not present

## 2016-05-29 DIAGNOSIS — M545 Low back pain: Secondary | ICD-10-CM | POA: Diagnosis not present

## 2016-05-29 MED ORDER — SIMVASTATIN 20 MG PO TABS: 20.0000 mg | ORAL_TABLET | Freq: Every day | ORAL | 5 refills | Status: DC

## 2016-05-29 NOTE — Patient Instructions (Signed)
Results for orders placed or performed in visit on 05/12/16  Lipid panel  Result Value Ref Range   Cholesterol, Total 194 100 - 199 mg/dL   Triglycerides 179 (H) 0 - 149 mg/dL   HDL 51 >39 mg/dL   VLDL Cholesterol Cal 36 5 - 40 mg/dL   LDL Calculated 107 (H) 0 - 99 mg/dL   Chol/HDL Ratio 3.8 0.0 - 5.0 ratio units  Hepatic function panel  Result Value Ref Range   Total Protein 7.0 6.0 - 8.5 g/dL   Albumin 4.4 3.5 - 5.5 g/dL   Bilirubin Total 0.7 0.0 - 1.2 mg/dL   Bilirubin, Direct 0.17 0.00 - 0.40 mg/dL   Alkaline Phosphatase 79 39 - 117 IU/L   AST 34 0 - 40 IU/L   ALT 65 (H) 0 - 44 IU/L  Basic metabolic panel  Result Value Ref Range   Glucose 109 (H) 65 - 99 mg/dL   BUN 12 6 - 24 mg/dL   Creatinine, Ser 1.20 0.76 - 1.27 mg/dL   GFR calc non Af Amer 70 >59 mL/min/1.73   GFR calc Af Amer 81 >59 mL/min/1.73   BUN/Creatinine Ratio 10 9 - 20   Sodium 141 134 - 144 mmol/L   Potassium 4.6 3.5 - 5.2 mmol/L   Chloride 99 96 - 106 mmol/L   CO2 25 18 - 29 mmol/L   Calcium 9.1 8.7 - 10.2 mg/dL  PSA  Result Value Ref Range   Prostate Specific Ag, Serum 1.3 0.0 - 4.0 ng/mL

## 2016-05-29 NOTE — Progress Notes (Signed)
Subjective:    Patient ID: Scott Meyers, male    DOB: [birth year], 50 y.o.   MRN: [id number]  HPI The patient comes in today for a wellness visit.  Results for orders placed or performed in visit on 05/12/16  Lipid panel  Result Value Ref Range   Cholesterol, Total 194 100 - 199 mg/dL   Triglycerides 179 (H) 0 - 149 mg/dL   HDL 51 >39 mg/dL   VLDL Cholesterol Cal 36 5 - 40 mg/dL   LDL Calculated 107 (H) 0 - 99 mg/dL   Chol/HDL Ratio 3.8 0.0 - 5.0 ratio units  Hepatic function panel  Result Value Ref Range   Total Protein 7.0 6.0 - 8.5 g/dL   Albumin 4.4 3.5 - 5.5 g/dL   Bilirubin Total 0.7 0.0 - 1.2 mg/dL   Bilirubin, Direct 0.17 0.00 - 0.40 mg/dL   Alkaline Phosphatase 79 39 - 117 IU/L   AST 34 0 - 40 IU/L   ALT 65 (H) 0 - 44 IU/L  Basic metabolic panel  Result Value Ref Range   Glucose 109 (H) 65 - 99 mg/dL   BUN 12 6 - 24 mg/dL   Creatinine, Ser 1.20 0.76 - 1.27 mg/dL   GFR calc non Af Amer 70 >59 mL/min/1.73   GFR calc Af Amer 81 >59 mL/min/1.73   BUN/Creatinine Ratio 10 9 - 20   Sodium 141 134 - 144 mmol/L   Potassium 4.6 3.5 - 5.2 mmol/L   Chloride 99 96 - 106 mmol/L   CO2 25 18 - 29 mmol/L   Calcium 9.1 8.7 - 10.2 mg/dL  PSA  Result Value Ref Range   Prostate Specific Ag, Serum 1.3 0.0 - 4.0 ng/mL   Patient continues to take lipid medication regularly. No obvious side effects from it. Generally does not miss a dose. Prior blood work results are reviewed with patient. Patient continues to work on fat intake in diet  No pros c no liv cancer  Willing to do colonoscopy     A review of their health history was completed.  A review of medications was also completed.  Any needed refills; none  Eating habits: health conscious  Falls/  MVA accidents in past few months: none  Regular exercise: walking  Specialist pt sees on regular basis: none  Preventative health issues were discussed.   Additional concerns: pain in left leg for a couple of years.  Getting worse. Deep ache at times. Focus in the left hip. Some radiation to the thigh and back towards the left posterior lumbar region. Worse with protracted physical effort    Review of Systems  Constitutional: Negative for activity change, appetite change and fever.  HENT: Negative for congestion and rhinorrhea.   Eyes: Negative for discharge.  Respiratory: Negative for cough and wheezing.   Cardiovascular: Negative for chest pain.  Gastrointestinal: Negative for abdominal pain, blood in stool and vomiting.  Genitourinary: Negative for difficulty urinating and frequency.  Musculoskeletal: Negative for neck pain.  Skin: Negative for rash.  Allergic/Immunologic: Negative for environmental allergies and food allergies.  Neurological: Negative for weakness and headaches.  Psychiatric/Behavioral: Negative for agitation.  All other systems reviewed and are negative.      Objective:   Physical Exam  Constitutional: He appears well-developed and well-nourished.  HENT:  Head: Normocephalic and atraumatic.  Right Ear: External ear normal.  Left Ear: External ear normal.  Nose: Nose normal.  Mouth/Throat: Oropharynx is clear and moist.  Eyes: EOM are normal.  Pupils are equal, round, and reactive to light.  Neck: Normal range of motion. Neck supple. No thyromegaly present.  Cardiovascular: Normal rate, regular rhythm and normal heart sounds.   No murmur heard. Pulmonary/Chest: Effort normal and breath sounds normal. No respiratory distress. He has no wheezes.  Abdominal: Soft. Bowel sounds are normal. He exhibits no distension and no mass. There is no tenderness.  Genitourinary: Penis normal.  Musculoskeletal: Normal range of motion. He exhibits no edema.  Left hip some pain with internal and external rotation positive left lower lumbar pain to percussion negative straight leg raise  Lymphadenopathy:    He has no cervical adenopathy.  Neurological: He is alert. He exhibits normal  muscle tone.  Skin: Skin is warm and dry. No erythema.  Psychiatric: He has a normal mood and affect. His behavior is normal. Judgment normal.  Vitals reviewed.         Assessment & Plan:  colonoscopyImpression 1 wellness exam #2 left chronic hip pain worsening likely within the joint. However with symptoms towards back with x-ray both hip and back #3 hyperlipidemia discussed prior blood work reviewed medications refilled. All blood work reviewed. Diet exercise discussed

## 2016-06-01 ENCOUNTER — Encounter (INDEPENDENT_AMBULATORY_CARE_PROVIDER_SITE_OTHER): Payer: Self-pay | Admitting: *Deleted

## 2016-06-01 ENCOUNTER — Ambulatory Visit (HOSPITAL_COMMUNITY)
Admission: RE | Admit: 2016-06-01 | Discharge: 2016-06-01 | Disposition: A | Discharge status: Home / Self Care | Payer: BC Managed Care – PPO | Source: Ambulatory Visit | Attending: Family Medicine | Admitting: Family Medicine

## 2016-06-01 DIAGNOSIS — M25552 Pain in left hip: Secondary | ICD-10-CM | POA: Diagnosis not present

## 2016-06-01 DIAGNOSIS — M545 Low back pain: Secondary | ICD-10-CM | POA: Diagnosis not present

## 2016-06-01 DIAGNOSIS — M47896 Other spondylosis, lumbar region: Secondary | ICD-10-CM | POA: Diagnosis not present

## 2016-06-17 ENCOUNTER — Other Ambulatory Visit (INDEPENDENT_AMBULATORY_CARE_PROVIDER_SITE_OTHER): Payer: Self-pay | Admitting: *Deleted

## 2016-06-17 DIAGNOSIS — Z1211 Encounter for screening for malignant neoplasm of colon: Secondary | ICD-10-CM | POA: Insufficient documentation

## 2016-08-14 ENCOUNTER — Encounter (INDEPENDENT_AMBULATORY_CARE_PROVIDER_SITE_OTHER): Payer: Self-pay | Admitting: *Deleted

## 2016-08-14 ENCOUNTER — Telehealth (INDEPENDENT_AMBULATORY_CARE_PROVIDER_SITE_OTHER): Payer: Self-pay | Admitting: *Deleted

## 2016-08-14 NOTE — Telephone Encounter (Signed)
Referring MD/PCP: steve luking   Procedure: tcs  Reason/Indication:  screening  Has patient had this procedure before?  no  If so, when, by whom and where?    Is there a family history of colon cancer?  no  Who?  What age when diagnosed?    Is patient diabetic?   no      Does patient have prosthetic heart valve or mechanical valve?  no  Do you have a pacemaker?  no  Has patient ever had endocarditis? no  Has patient had joint replacement within last 12 months?  no  Does patient tend to be constipated or take laxatives? no  Does patient have a history of alcohol/drug use?  An occasional beer  Is patient on Coumadin, Plavix and/or Aspirin? no  Medications: simvastatin 20 mg daily  Allergies: nkda  Medication Adjustment:   Procedure date & time: 09/10/16 at 830

## 2016-08-14 NOTE — Telephone Encounter (Signed)
Patient needs trilyte 

## 2016-08-17 MED ORDER — PEG 3350-KCL-NA BICARB-NACL 420 G PO SOLR: 4000.0000 mL | Freq: Once | ORAL | 0 refills | Status: AC

## 2016-08-17 NOTE — Telephone Encounter (Signed)
agree

## 2016-09-09 ENCOUNTER — Encounter (HOSPITAL_COMMUNITY): Payer: Self-pay | Admitting: *Deleted

## 2016-09-10 ENCOUNTER — Ambulatory Visit (HOSPITAL_COMMUNITY)
Admission: RE | Admit: 2016-09-10 | Discharge: 2016-09-10 | Disposition: A | Discharge status: Home / Self Care | Payer: BC Managed Care – PPO | Source: Ambulatory Visit | Attending: Internal Medicine | Admitting: Internal Medicine

## 2016-09-10 ENCOUNTER — Encounter (HOSPITAL_COMMUNITY): Payer: Self-pay | Admitting: *Deleted

## 2016-09-10 ENCOUNTER — Encounter (HOSPITAL_COMMUNITY)
Admission: RE | Disposition: A | Discharge status: Home / Self Care | Payer: Self-pay | Source: Ambulatory Visit | Attending: Internal Medicine

## 2016-09-10 DIAGNOSIS — Z7982 Long term (current) use of aspirin: Secondary | ICD-10-CM | POA: Diagnosis not present

## 2016-09-10 DIAGNOSIS — F17200 Nicotine dependence, unspecified, uncomplicated: Secondary | ICD-10-CM | POA: Insufficient documentation

## 2016-09-10 DIAGNOSIS — K644 Residual hemorrhoidal skin tags: Secondary | ICD-10-CM | POA: Insufficient documentation

## 2016-09-10 DIAGNOSIS — Z1211 Encounter for screening for malignant neoplasm of colon: Secondary | ICD-10-CM | POA: Diagnosis not present

## 2016-09-10 DIAGNOSIS — C187 Malignant neoplasm of sigmoid colon: Secondary | ICD-10-CM | POA: Diagnosis not present

## 2016-09-10 DIAGNOSIS — Z79899 Other long term (current) drug therapy: Secondary | ICD-10-CM | POA: Diagnosis not present

## 2016-09-10 DIAGNOSIS — M109 Gout, unspecified: Secondary | ICD-10-CM | POA: Diagnosis not present

## 2016-09-10 DIAGNOSIS — D128 Benign neoplasm of rectum: Secondary | ICD-10-CM | POA: Insufficient documentation

## 2016-09-10 DIAGNOSIS — K621 Rectal polyp: Secondary | ICD-10-CM | POA: Diagnosis not present

## 2016-09-10 DIAGNOSIS — K5730 Diverticulosis of large intestine without perforation or abscess without bleeding: Secondary | ICD-10-CM

## 2016-09-10 DIAGNOSIS — E785 Hyperlipidemia, unspecified: Secondary | ICD-10-CM | POA: Insufficient documentation

## 2016-09-10 HISTORY — PX: POLYPECTOMY: SHX5525

## 2016-09-10 HISTORY — PX: COLONOSCOPY: SHX5424

## 2016-09-10 SURGERY — COLONOSCOPY
Anesthesia: Moderate Sedation

## 2016-09-10 MED ORDER — MEPERIDINE HCL 50 MG/ML IJ SOLN
INTRAMUSCULAR | Status: AC
  Filled 2016-09-10: qty 1

## 2016-09-10 MED ORDER — SODIUM CHLORIDE 0.9 % IV SOLN
INTRAVENOUS | Status: DC
  Administered 2016-09-10: 08:00:00 via INTRAVENOUS

## 2016-09-10 MED ORDER — MIDAZOLAM HCL 5 MG/5ML IJ SOLN
INTRAMUSCULAR | Status: AC
  Filled 2016-09-10: qty 10

## 2016-09-10 MED ORDER — MIDAZOLAM HCL 5 MG/5ML IJ SOLN
INTRAMUSCULAR | Status: DC | PRN
  Administered 2016-09-10: 3 mg via INTRAVENOUS
  Administered 2016-09-10: 1 mg via INTRAVENOUS
  Administered 2016-09-10 (×3): 2 mg via INTRAVENOUS

## 2016-09-10 MED ORDER — MEPERIDINE HCL 50 MG/ML IJ SOLN
INTRAMUSCULAR | Status: DC | PRN
  Administered 2016-09-10 (×2): 25 mg via INTRAVENOUS

## 2016-09-10 MED ORDER — SIMETHICONE 40 MG/0.6ML PO SUSP
ORAL | Status: DC | PRN
  Administered 2016-09-10: 09:00:00

## 2016-09-10 NOTE — Progress Notes (Signed)
Please excuse Mr. Evens Soderberg from work starting September 10, 2015 thru September 11, 2016 due to procedure performed at [facility].  If you have any questions please feel free to call.  Charlyne Petrin, Rn

## 2016-09-10 NOTE — Op Note (Signed)
[facility] Patient Name: Scott Meyers Procedure Date: 09/10/2016 8:22 AM MRN: [id number] Date of Birth: 1966-03-23 Attending MD: Hildred Laser , MD CSN: [id number] Age: 51 Admit Type: Outpatient Procedure:                Colonoscopy Indications:              Screening for colorectal malignant neoplasm Providers:                Hildred Laser, MD, Jeanann Lewandowsky. Sharon Seller, RN, Rosina Lowenstein, RN Referring MD:             Grace Bushy. Wolfgang Phoenix, MD Medicines:                Meperidine 50 mg IV, Midazolam 10 mg IV Complications:            No immediate complications. Estimated Blood Loss:     Estimated blood loss: none. Estimated blood loss                            was minimal. Procedure:                Pre-Anesthesia Assessment:                           - Prior to the procedure, a History and Physical                            was performed, and patient medications and                            allergies were reviewed. The patient's tolerance of                            previous anesthesia was also reviewed. The risks                            and benefits of the procedure and the sedation                            options and risks were discussed with the patient.                            All questions were answered, and informed consent                            was obtained. Prior Anticoagulants: The patient has                            taken no previous anticoagulant or antiplatelet                            agents. ASA Grade Assessment: I - A normal, healthy  patient. After reviewing the risks and benefits,                            the patient was deemed in satisfactory condition to                            undergo the procedure.                           After obtaining informed consent, the colonoscope                            was passed under direct vision. Throughout the                            procedure, the  patient's blood pressure, pulse, and                            oxygen saturations were monitored continuously. The                            [id number]) scope was introduced through                            the anus and advanced to the the cecum, identified                            by appendiceal orifice and ileocecal valve. The                            colonoscopy was performed without difficulty. The                            patient tolerated the procedure well. The quality                            of the bowel preparation was adequate. The                            ileocecal valve, appendiceal orifice, and rectum                            were photographed. Scope In: 8:40:08 AM Scope Out: 9:07:55 AM Scope Withdrawal Time: 0 hours 23 minutes 12 seconds  Total Procedure Duration: 0 hours 27 minutes 47 seconds  Findings:      The perianal and digital rectal examinations were normal.      A few small-mouthed diverticula were found in the sigmoid colon.      A 25 mm polyp was found in the mid sigmoid colon. The polyp was       semi-pedunculated. The polyp was removed with a hot snare. Resection and       retrieval were complete. To close a defect after polypectomy, two       hemostatic clips were successfully placed (MR conditional). There was no  bleeding at the end of the procedure.      A 12 mm polyp was found in the rectum. The polyp was semi-pedunculated.       The polyp was removed with a hot snare. Resection and retrieval were       complete.      External hemorrhoids were found during retroflexion. The hemorrhoids       were small. Impression:               - Diverticulosis in the sigmoid colon.                           - One 25 mm polyp in the mid sigmoid colon, removed                            with a hot snare. Resected and retrieved. Clips (MR                            conditional) were placed.                           - One 12 mm polyp in the  rectum, removed with a hot                            snare. Resected and retrieved.                           - External hemorrhoids. Moderate Sedation:      Moderate (conscious) sedation was administered by the endoscopy nurse       and supervised by the endoscopist. The following parameters were       monitored: oxygen saturation, heart rate, blood pressure, CO2       capnography and response to care. Total physician intraservice time was       36 minutes. Recommendation:           - Patient has a contact number available for                            emergencies. The signs and symptoms of potential                            delayed complications were discussed with the                            patient. Return to normal activities tomorrow.                            Written discharge instructions were provided to the                            patient.                           - High fiber diet today.                           -  Continue present medications.                           - No aspirin, ibuprofen, naproxen, or other                            non-steroidal anti-inflammatory drugs for 7 days                            after polyp removal.                           - Await pathology results.                           - Repeat colonoscopy in 3 years for surveillance. Procedure Code(s):        --- Professional ---                           [phone], Colonoscopy, flexible; with removal of                            tumor(s), polyp(s), or other lesion(s) by snare                            technique                           99152, Moderate sedation services provided by the                            same physician or other qualified health care                            professional performing the diagnostic or                            therapeutic service that the sedation supports,                            requiring the presence of an independent trained                             observer to assist in the monitoring of the                            patient's level of consciousness and physiological                            status; initial 15 minutes of intraservice time,                            patient age 2 years or older                           99153, Moderate sedation  services; each additional                            15 minutes intraservice time Diagnosis Code(s):        --- Professional ---                           Z12.11, Encounter for screening for malignant                            neoplasm of colon                           K64.4, Residual hemorrhoidal skin tags                           D12.5, Benign neoplasm of sigmoid colon                           K62.1, Rectal polyp                           K57.30, Diverticulosis of large intestine without                            perforation or abscess without bleeding CPT copyright 2016 American Medical Association. All rights reserved. The codes documented in this report are preliminary and upon coder review may  be revised to meet current compliance requirements. Hildred Laser, MD Hildred Laser, MD 09/10/2016 9:17:04 AM This report has been signed electronically. Number of Addenda: 0

## 2016-09-10 NOTE — H&P (Signed)
Scott Meyers is an 51 y.o. male.   Chief Complaint: Patient is here for colonoscopy. HPI: Patient is 51 year old Caucasian male who is in for screening colonoscopy. He denies abdominal pain change in bowel habits or rectal bleeding. Family history negative for colon carcinoma but positive for non GI malignancies.  Past Medical History:  Diagnosis Date  . Gout   . Hyperlipidemia   . Impaired fasting glucose     Past Surgical History:  Procedure Laterality Date  . APPENDECTOMY      Family History  Problem Relation Age of Onset  . Cancer Mother   . Hypertension Father   . Cancer Father   . Colon cancer Neg Hx    Social History:  reports that he has never smoked. His smokeless tobacco use includes Snuff. He reports that he drinks about 7.2 oz of alcohol per week . He reports that he does not use drugs.  Allergies: No Known Allergies  Medications Prior to Admission  Medication Sig Dispense Refill  . simvastatin (ZOCOR) 20 MG tablet Take 1 tablet (20 mg total) by mouth daily at 6 PM. (Patient taking differently: Take 20 mg by mouth daily. ) 30 tablet 5  . acetaminophen (TYLENOL) 500 MG tablet Take 500 mg by mouth daily as needed for headache.    . Aspirin-Salicylamide-Caffeine (BC HEADACHE PO) Take 1 packet by mouth daily as needed (headache).      No results found for this or any previous visit (from the past 48 hour(s)). No results found.  ROS  Blood pressure (!) 137/92, pulse 77, temperature 98.4 F (36.9 C), temperature source Oral, resp. rate 16, height 5\' 8"  (1.727 m), weight 186 lb (84.4 kg), SpO2 98 %. Physical Exam  Constitutional: He appears well-developed and well-nourished.  HENT:  Mouth/Throat: Oropharynx is clear and moist.  Eyes: Conjunctivae are normal. No scleral icterus.  Neck: No thyromegaly present.  Cardiovascular: Normal rate, regular rhythm and normal heart sounds.   No murmur heard. Respiratory: Effort normal and breath sounds normal.  GI: Soft. He  exhibits no distension and no mass. There is no tenderness.  Musculoskeletal: He exhibits no edema.  Lymphadenopathy:    He has no cervical adenopathy.  Neurological: He is alert.  Skin: Skin is warm and dry.     Assessment/Plan Average risk screening colonoscopy.  Hildred Laser, MD 09/10/2016, 8:27 AM

## 2016-09-10 NOTE — Discharge Instructions (Signed)
No aspirin or NSAIDs for 1 week. Resume other medications and high fiber diet. No driving for 24 hours. Physician will call with biopsy results.  Colonoscopy, Adult, Care After This sheet gives you information about how to care for yourself after your procedure. Your health care provider may also give you more specific instructions. If you have problems or questions, contact your health care provider. What can I expect after the procedure? After the procedure, it is common to have:  A small amount of blood in your stool for 24 hours after the procedure.  Some gas.  Mild abdominal cramping or bloating. Follow these instructions at home: General instructions    For the first 24 hours after the procedure:  Do not drive or use machinery.  Do not sign important documents.  Do not drink alcohol.  Do your regular daily activities at a slower pace than normal.  Eat soft, easy-to-digest foods.  Rest often.  Take over-the-counter or prescription medicines only as told by your health care provider.  It is up to you to get the results of your procedure. Ask your health care provider, or the department performing the procedure, when your results will be ready. Relieving cramping and bloating   Try walking around when you have cramps or feel bloated.  Apply heat to your abdomen as told by your health care provider. Use a heat source that your health care provider recommends, such as a moist heat pack or a heating pad.  Place a towel between your skin and the heat source.  Leave the heat on for 20-30 minutes.  Remove the heat if your skin turns bright red. This is especially important if you are unable to feel pain, heat, or cold. You may have a greater risk of getting burned. Eating and drinking   Drink enough fluid to keep your urine clear or pale yellow.  Resume your normal diet as instructed by your health care provider. Avoid heavy or fried foods that are hard to  digest.  Avoid drinking alcohol for as long as instructed by your health care provider. Contact a health care provider if:  You have blood in your stool 2-3 days after the procedure. Get help right away if:  You have more than a small spotting of blood in your stool.  You pass large blood clots in your stool.  Your abdomen is swollen.  You have nausea or vomiting.  You have a fever.  You have increasing abdominal pain that is not relieved with medicine. This information is not intended to replace advice given to you by your health care provider. Make sure you discuss any questions you have with your health care provider. Document Released: 02/11/2004 Document Revised: 03/23/2016 Document Reviewed: 09/10/2015 Elsevier Interactive Patient Education  2017 [place].    High-Fiber Diet Fiber, also called dietary fiber, is a type of carbohydrate found in fruits, vegetables, whole grains, and beans. A high-fiber diet can have many health benefits. Your health care provider may recommend a high-fiber diet to help:  Prevent constipation. Fiber can make your bowel movements more regular.  Lower your cholesterol.  Relieve hemorrhoids, uncomplicated diverticulosis, or irritable bowel syndrome.  Prevent overeating as part of a weight-loss plan.  Prevent heart disease, type 2 diabetes, and certain cancers. What is my plan? The recommended daily intake of fiber includes:  38 grams for men under age 16.  74 grams for men over age 38.  75 grams for women under age 37.  21  grams for women over age 97. You can get the recommended daily intake of dietary fiber by eating a variety of fruits, vegetables, grains, and beans. Your health care provider may also recommend a fiber supplement if it is not possible to get enough fiber through your diet. What do I need to know about a high-fiber diet?  Fiber supplements have not been widely studied for their effectiveness, so it is better to  get fiber through food sources.  Always check the fiber content on thenutrition facts label of any prepackaged food. Look for foods that contain at least 5 grams of fiber per serving.  Ask your dietitian if you have questions about specific foods that are related to your condition, especially if those foods are not listed in the following section.  Increase your daily fiber consumption gradually. Increasing your intake of dietary fiber too quickly may cause bloating, cramping, or gas.  Drink plenty of water. Water helps you to digest fiber. What foods can I eat? Grains  Whole-grain breads. Multigrain cereal. Oats and oatmeal. Brown rice. Barley. Bulgur wheat. [place]. Bran muffins. Popcorn. Rye wafer crackers. Vegetables  Sweet potatoes. Spinach. Kale. Artichokes. Cabbage. Broccoli. Green peas. Carrots. Squash. Fruits  Berries. Pears. Apples. Oranges. Avocados. Prunes and raisins. Dried figs. Meats and Other Protein Sources  [place]y, kidney, pinto, and soy beans. Split peas. Lentils. Nuts and seeds. Dairy  Fiber-fortified yogurt. Beverages  Fiber-fortified soy milk. Fiber-fortified orange juice. Other  Fiber bars. The items listed above may not be a complete list of recommended foods or beverages. Contact your dietitian for more options.  What foods are not recommended? Grains  White bread. Pasta made with refined flour. White rice. Vegetables  Fried potatoes. Canned vegetables. Well-cooked vegetables. Fruits  Fruit juice. Cooked, strained fruit. Meats and Other Protein Sources  Fatty cuts of meat. Fried Sales executive or fried fish. Dairy  Milk. Yogurt. Cream cheese. Sour cream. Beverages  Soft drinks. Other  Cakes and pastries. Butter and oils. The items listed above may not be a complete list of foods and beverages to avoid. Contact your dietitian for more information.  What are some tips for including high-fiber foods in my diet?  Eat a wide variety of high-fiber foods.  Make  sure that half of all grains consumed each day are whole grains.  Replace breads and cereals made from refined flour or white flour with whole-grain breads and cereals.  Replace white rice with brown rice, bulgur wheat, or millet.  Start the day with a breakfast that is high in fiber, such as a cereal that contains at least 5 grams of fiber per serving.  Use beans in place of meat in soups, salads, or pasta.  Eat high-fiber snacks, such as berries, raw vegetables, nuts, or popcorn. This information is not intended to replace advice given to you by your health care provider. Make sure you discuss any questions you have with your health care provider. Document Released: 06/29/2005 Document Revised: 12/05/2015 Document Reviewed: 12/12/2013 Elsevier Interactive Patient Education  2017 [employer].

## 2016-09-11 ENCOUNTER — Encounter (HOSPITAL_COMMUNITY): Payer: Self-pay | Admitting: Internal Medicine

## 2016-09-16 ENCOUNTER — Other Ambulatory Visit (INDEPENDENT_AMBULATORY_CARE_PROVIDER_SITE_OTHER): Payer: Self-pay | Admitting: *Deleted

## 2016-09-16 DIAGNOSIS — C189 Malignant neoplasm of colon, unspecified: Secondary | ICD-10-CM

## 2016-09-17 ENCOUNTER — Encounter: Payer: Self-pay | Admitting: General Surgery

## 2016-09-17 ENCOUNTER — Ambulatory Visit (INDEPENDENT_AMBULATORY_CARE_PROVIDER_SITE_OTHER): Payer: BC Managed Care – PPO | Admitting: General Surgery

## 2016-09-17 VITALS — BP 146/99 | HR 91 | Temp 98.9°F | Resp 18 | Ht 67.0 in | Wt 202.0 lb

## 2016-09-17 DIAGNOSIS — C187 Malignant neoplasm of sigmoid colon: Secondary | ICD-10-CM | POA: Diagnosis not present

## 2016-09-17 MED ORDER — PEG 3350-KCL-NA BICARB-NACL 420 G PO SOLR: 4000.0000 mL | Freq: Once | ORAL | 0 refills | Status: AC

## 2016-09-17 MED ORDER — NEOMYCIN SULFATE 500 MG PO TABS: 1000.0000 mg | ORAL_TABLET | Freq: Three times a day (TID) | ORAL | 0 refills | Status: DC

## 2016-09-17 MED ORDER — METRONIDAZOLE 500 MG PO TABS: 500.0000 mg | ORAL_TABLET | Freq: Three times a day (TID) | ORAL | 0 refills | Status: DC

## 2016-09-17 NOTE — H&P (Signed)
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 He has been referred by Dr. Laural Golden for further evaluation and treatment. Patient denies any family history of colon cancer. He is in no pain. He denies any fever, chills, blood per rectum, melena, diarrhea, or constipation.     Past Medical History:  Diagnosis Date  . Gout   . Hyperlipidemia   . Impaired fasting glucose          Past Surgical History:  Procedure Laterality Date  . APPENDECTOMY    . COLONOSCOPY N/A 09/10/2016   Procedure: COLONOSCOPY;  Surgeon: Rogene Houston, MD;  Location: [facility];  Service: Endoscopy;  Laterality: N/A;  830  . POLYPECTOMY  09/10/2016   Procedure: POLYPECTOMY;  Surgeon: Rogene Houston, MD;  Location: [facility];  Service: Endoscopy;;  colon         Family History  Problem Relation Age of Onset  . Cancer Mother   . Hypertension Father   . Cancer Father   . Colon cancer Neg Hx           Current Outpatient Prescriptions on File Prior to Visit  Medication Sig Dispense Refill  . acetaminophen (TYLENOL) 500 MG tablet Take 500-1,000 mg by mouth every 8 (eight) hours as needed for headache.     . simvastatin (ZOCOR) 20 MG tablet Take 1 tablet (20 mg total) by mouth daily at 6 PM. (Patient taking differently: Take 20 mg by mouth daily. ) 30 tablet 5   No current facility-administered medications on file prior to visit.     No Known Allergies     History  Alcohol Use  . 7.2 oz/week  . 12 Cans of beer per week    History  Smoking Status  . Never Smoker  Smokeless Tobacco  . Current User  . Types: Snuff    Review of Systems  Constitutional: Negative.   HENT: Negative.   Eyes: Negative.   Respiratory: Negative.   Cardiovascular: Negative.   Gastrointestinal: Negative.   Genitourinary: Negative.   Musculoskeletal:  Positive for joint pain.  Skin: Negative.   Neurological: Negative.   Endo/Heme/Allergies: Negative.   Psychiatric/Behavioral: Negative.     Objective      Vitals:   09/17/16 1034  BP: (!) 146/99  Pulse: 91  Resp: 18  Temp: 98.9 F (37.2 C)    Physical Exam  Constitutional: He is oriented to person, place, and time and well-developed, well-nourished, and in no distress.  HENT:  Head: Normocephalic and atraumatic.  Neck: Normal range of motion. Neck supple.  Cardiovascular: Normal rate, regular rhythm and normal heart sounds.   Pulmonary/Chest: Effort normal and breath sounds normal. He has no wheezes. He has no rales.  Abdominal: Soft. Bowel sounds are normal. He exhibits no distension and no mass. There is no tenderness.  Neurological: He is alert and oriented to person, place, and time.  Skin: Skin is warm and dry.  Vitals reviewed.  Dr. Olevia Perches colonoscopy report reviewed. Final pathology report reviewed. Assessment  Colon carcinoma, sigmoid Plan   Patient is scheduled for a partial colectomy on 09/23/2016. The risks and benefits of the procedure including bleeding, infection, anastomotic leak, and the possibility of needing chemotherapy were fully explained to the patient, who gave informed consent. TriLyte, neomycin, and Flagyl have been prescribed preoperatively for a bowel preparation.

## 2016-09-17 NOTE — Patient Instructions (Signed)
Open Colectomy An open colectomy is surgery to remove part or all of the large intestine (colon). This procedure may be used to treat several conditions, including:  Inflammation and infection of the colon (diverticulitis).  Tumors or masses in the colon.  Inflammatory bowel disease, such as Crohn disease or ulcerative colitis.  Bleeding from the colon.  Blockage or obstruction of the colon. Tell a health care provider about:  Any allergies you have.  All medicines you are taking, including vitamins, herbs, eye drops, creams, and over-the-counter medicines.  Any problems you or family members have had with anesthetic medicines.  Any blood disorders you have.  Any surgeries you have had.  Any medical conditions you have.  Whether you are pregnant or may be pregnant.  Whether you smoke or use tobacco products. These can affect your body's reaction to anesthesia. What are the risks? Generally, this is a safe procedure. However, problems may occur, including:  Infection.  Bleeding.  Allergic reactions to medicines.  Damage to other structures or organs.  Pneumonia.  The incision opening up.  Tissues from inside the abdomen bulging through the incision (hernia).  Reopening of the colon where it was stitched or stapled together.  A blood clot forming in a vein and traveling to the lungs.  Future blockage of the small intestine from scar tissue. What happens before the procedure? Staying hydrated  Follow instructions from your health care provider about hydration, which may include:  Up to 2 hours before the procedure - you may continue to drink clear liquids, such as water, clear fruit juice, black coffee, and plain tea. Eating and drinking restrictions  Follow instructions from your health care provider about eating and drinking, which may include:  8 hours before the procedure - stop eating heavy meals or foods such as meat, fried foods, or fatty foods.  6  hours before the procedure - stop eating light meals or foods, such as toast or cereal.  6 hours before the procedure - stop drinking milk or drinks that contain milk.  2 hours before the procedure - stop drinking clear liquids. Bowel prep  In some cases, you may be prescribed an oral bowel prep to clean out your colon. If so:  Take it as told by your health care provider. Starting the day before your procedure, you may need to drink a large amount of medicated liquid. The liquid will cause you to have multiple loose stools until your stool is almost clear or light green.  Follow instructions from your health care provider about eating and drinking restrictions during bowel prep. Medicines   Ask your health care provider about:  Changing or stopping your regular medicines or vitamins. This is especially important if you are taking diabetes medicines, blood thinners, or vitamin E.  Taking medicines such as aspirin and ibuprofen. These medicines can thin your blood. Do not take these medicines before your procedure if your health care provider instructs you not to.  If you were prescribed an antibiotic medicine, take it as told by your health care provider. General instructions   Bring loose-fitting, comfortable clothing and slip-on shoes that you can put on without bending over.  Make sure to see your health care provider for any tests that you need before the procedure, such as:  Blood tests.  A test to check the heart's rhythm (electrocardiogram, ECG).  A CT scan of your abdomen.  Urine tests.  Colonoscopy.  Plan to have someone take you home from the  hospital or clinic.  Arrange for someone to help you with your activities during your recovery. What happens during the procedure?  To reduce your risk of infection:  Your health care team will wash or sanitize their hands.  Your skin will be washed with soap.  Hair may be removed from the surgical area.  An IV tube  will be inserted into one of your veins. The tube will be used to give you medicines and fluids.  You will be given a medicine to make you fall asleep (general anesthetic). You may also be given a medicine to help you relax (sedative).  Small monitors will be connected to your body. They will be used to check your heart, blood pressure, and oxygen level.  A breathing tube may be placed into your lungs during the procedure.  A thin, flexible tube (catheter) will be placed into your bladder to drain urine.  A tube may be inserted through your nose and into your stomach (nasogastric tube, or NG tube). The tube is used to remove stomach fluids after surgery until the intestines start working again.  An incision will be made in your abdomen.  Clamps or staples will be put on your colon.  The part of the colon between the clamps or staples will be removed.  The ends of the colon that remain will be stitched or stapled together.  The incision in your abdomen will be closed with stitches (sutures) or staples.  The incision will be covered with a bandage (dressing).  A small opening (stoma) may be created in your lower abdomen. A removable, external pouch (ostomy pouch) will be attached to the stoma. This pouch will collect stool outside of your body. Stool passes through the stoma and into the pouch instead of through your anus. The procedure may vary among health care providers and hospitals. What happens after the procedure?  Your blood pressure, heart rate, breathing rate, and blood oxygen level will be monitored until the medicines you were given have worn off.  You may continue to receive fluids and medicines through an IV tube.  You will start on a clear liquid diet and gradually go back to a normal diet.  Do not drive until your health care provider approves.  You may have some pain in your abdomen. You will be given pain medicine to control the pain.  You will be encouraged to  do the following:  Do breathing exercises to prevent pneumonia.  Get up and start walking within a day after surgery. You should try to get up 5-6 times a day. This information is not intended to replace advice given to you by your health care provider. Make sure you discuss any questions you have with your health care provider. Document Released: 04/26/2009 Document Revised: 03/30/2016 Document Reviewed: 03/30/2016 Elsevier Interactive Patient Education  2017 [employer].

## 2016-09-17 NOTE — Progress Notes (Signed)
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 He has been referred by Dr. Laural Golden for further evaluation and treatment. Patient denies any family history of colon cancer. He is in no pain. He denies any fever, chills, blood per rectum, melena, diarrhea, or constipation. Past Medical History:  Diagnosis Date  . Gout   . Hyperlipidemia   . Impaired fasting glucose     Past Surgical History:  Procedure Laterality Date  . APPENDECTOMY    . COLONOSCOPY N/A 09/10/2016   Procedure: COLONOSCOPY;  Surgeon: Rogene Houston, MD;  Location: [facility];  Service: Endoscopy;  Laterality: N/A;  830  . POLYPECTOMY  09/10/2016   Procedure: POLYPECTOMY;  Surgeon: Rogene Houston, MD;  Location: [facility];  Service: Endoscopy;;  colon    Family History  Problem Relation Age of Onset  . Cancer Mother   . Hypertension Father   . Cancer Father   . Colon cancer Neg Hx     Current Outpatient Prescriptions on File Prior to Visit  Medication Sig Dispense Refill  . acetaminophen (TYLENOL) 500 MG tablet Take 500-1,000 mg by mouth every 8 (eight) hours as needed for headache.     . simvastatin (ZOCOR) 20 MG tablet Take 1 tablet (20 mg total) by mouth daily at 6 PM. (Patient taking differently: Take 20 mg by mouth daily. ) 30 tablet 5   No current facility-administered medications on file prior to visit.     No Known Allergies  History  Alcohol Use  . 7.2 oz/week  . 12 Cans of beer per week    History  Smoking Status  . Never Smoker  Smokeless Tobacco  . Current User  . Types: Snuff    Review of Systems  Constitutional: Negative.   HENT: Negative.   Eyes: Negative.   Respiratory: Negative.   Cardiovascular: Negative.   Gastrointestinal: Negative.   Genitourinary: Negative.   Musculoskeletal: Positive for joint pain.  Skin: Negative.   Neurological:  Negative.   Endo/Heme/Allergies: Negative.   Psychiatric/Behavioral: Negative.     Objective   Vitals:   09/17/16 1034  BP: (!) 146/99  Pulse: 91  Resp: 18  Temp: 98.9 F (37.2 C)    Physical Exam  Constitutional: He is oriented to person, place, and time and well-developed, well-nourished, and in no distress.  HENT:  Head: Normocephalic and atraumatic.  Neck: Normal range of motion. Neck supple.  Cardiovascular: Normal rate, regular rhythm and normal heart sounds.   Pulmonary/Chest: Effort normal and breath sounds normal. He has no wheezes. He has no rales.  Abdominal: Soft. Bowel sounds are normal. He exhibits no distension and no mass. There is no tenderness.  Neurological: He is alert and oriented to person, place, and time.  Skin: Skin is warm and dry.  Vitals reviewed.  Dr. Olevia Perches colonoscopy report reviewed. Final pathology report reviewed. Assessment  Colon carcinoma, sigmoid Plan   Patient is scheduled for a partial colectomy on 09/23/2016. The risks and benefits of the procedure including bleeding, infection, anastomotic leak, and the possibility of needing chemotherapy were fully explained to the patient, who gave informed consent. TriLyte, neomycin, and Flagyl have been prescribed preoperatively for a bowel preparation.

## 2016-09-18 ENCOUNTER — Ambulatory Visit (HOSPITAL_COMMUNITY)
Admission: RE | Admit: 2016-09-18 | Discharge: 2016-09-18 | Disposition: A | Discharge status: Home / Self Care | Payer: BC Managed Care – PPO | Source: Ambulatory Visit | Attending: Internal Medicine | Admitting: Internal Medicine

## 2016-09-18 DIAGNOSIS — C189 Malignant neoplasm of colon, unspecified: Secondary | ICD-10-CM | POA: Diagnosis not present

## 2016-09-18 DIAGNOSIS — K760 Fatty (change of) liver, not elsewhere classified: Secondary | ICD-10-CM | POA: Insufficient documentation

## 2016-09-18 MED ORDER — IOPAMIDOL (ISOVUE-300) INJECTION 61%
100.0000 mL | Freq: Once | INTRAVENOUS | Status: AC | PRN
  Administered 2016-09-18: 100 mL via INTRAVENOUS

## 2016-09-18 NOTE — Patient Instructions (Signed)
Scott Meyers  09/18/2016     @PREFPERIOPPHARMACY @   Your procedure is scheduled on  09/23/2016  Report to [facility] at  1015  A.M.  Call this number if you have problems the morning of surgery:  [phone]   Remember:  Do not eat food or drink liquids after midnight.  Take these medicines the morning of surgery with A SIP OF WATER  None   Do not wear jewelry, make-up or nail polish.  Do not wear lotions, powders, or perfumes, or deoderant.  Do not shave 48 hours prior to surgery.  Men may shave face and neck.  Do not bring valuables to the hospital.  [facility] is not responsible for any belongings or valuables.  Contacts, dentures or bridgework may not be worn into surgery.  Leave your suitcase in the car.  After surgery it may be brought to your room.  For patients admitted to the hospital, discharge time will be determined by your treatment team.  Patients discharged the day of surgery will not be allowed to drive home.   Name and phone number of your driver:   Family Special instructions:  Follow the diet and prep inatructions given to you by Dr Arnoldo Morale.  Please read over the following fact sheets that you were given. Pain Booklet, Coughing and Deep Breathing, Blood Transfusion Information, Surgical Site Infection Prevention, Anesthesia Post-op Instructions and Care and Recovery After Surgery       Open Colectomy An open colectomy is surgery to remove part or all of the large intestine (colon). This procedure may be used to treat several conditions, including:  Inflammation and infection of the colon (diverticulitis).  Tumors or masses in the colon.  Inflammatory bowel disease, such as Crohn disease or ulcerative colitis.  Bleeding from the colon.  Blockage or obstruction of the colon. Tell a health care provider about:  Any allergies you have.  All medicines you are taking, including vitamins, herbs, eye drops, creams, and over-the-counter  medicines.  Any problems you or family members have had with anesthetic medicines.  Any blood disorders you have.  Any surgeries you have had.  Any medical conditions you have.  Whether you are pregnant or may be pregnant.  Whether you smoke or use tobacco products. These can affect your body's reaction to anesthesia. What are the risks? Generally, this is a safe procedure. However, problems may occur, including:  Infection.  Bleeding.  Allergic reactions to medicines.  Damage to other structures or organs.  Pneumonia.  The incision opening up.  Tissues from inside the abdomen bulging through the incision (hernia).  Reopening of the colon where it was stitched or stapled together.  A blood clot forming in a vein and traveling to the lungs.  Future blockage of the small intestine from scar tissue. What happens before the procedure? Staying hydrated  Follow instructions from your health care provider about hydration, which may include:  Up to 2 hours before the procedure - you may continue to drink clear liquids, such as water, clear fruit juice, black coffee, and plain tea. Eating and drinking restrictions  Follow instructions from your health care provider about eating and drinking, which may include:  8 hours before the procedure - stop eating heavy meals or foods such as meat, fried foods, or fatty foods.  6 hours before the procedure - stop eating light meals or foods, such as toast or cereal.  6 hours before  the procedure - stop drinking milk or drinks that contain milk.  2 hours before the procedure - stop drinking clear liquids. Bowel prep  In some cases, you may be prescribed an oral bowel prep to clean out your colon. If so:  Take it as told by your health care provider. Starting the day before your procedure, you may need to drink a large amount of medicated liquid. The liquid will cause you to have multiple loose stools until your stool is almost clear  or light green.  Follow instructions from your health care provider about eating and drinking restrictions during bowel prep. Medicines   Ask your health care provider about:  Changing or stopping your regular medicines or vitamins. This is especially important if you are taking diabetes medicines, blood thinners, or vitamin E.  Taking medicines such as aspirin and ibuprofen. These medicines can thin your blood. Do not take these medicines before your procedure if your health care provider instructs you not to.  If you were prescribed an antibiotic medicine, take it as told by your health care provider. General instructions   Bring loose-fitting, comfortable clothing and slip-on shoes that you can put on without bending over.  Make sure to see your health care provider for any tests that you need before the procedure, such as:  Blood tests.  A test to check the heart's rhythm (electrocardiogram, ECG).  A CT scan of your abdomen.  Urine tests.  Colonoscopy.  Plan to have someone take you home from the hospital or clinic.  Arrange for someone to help you with your activities during your recovery. What happens during the procedure?  To reduce your risk of infection:  Your health care team will wash or sanitize their hands.  Your skin will be washed with soap.  Hair may be removed from the surgical area.  An IV tube will be inserted into one of your veins. The tube will be used to give you medicines and fluids.  You will be given a medicine to make you fall asleep (general anesthetic). You may also be given a medicine to help you relax (sedative).  Small monitors will be connected to your body. They will be used to check your heart, blood pressure, and oxygen level.  A breathing tube may be placed into your lungs during the procedure.  A thin, flexible tube (catheter) will be placed into your bladder to drain urine.  A tube may be inserted through your nose and into  your stomach (nasogastric tube, or NG tube). The tube is used to remove stomach fluids after surgery until the intestines start working again.  An incision will be made in your abdomen.  Clamps or staples will be put on your colon.  The part of the colon between the clamps or staples will be removed.  The ends of the colon that remain will be stitched or stapled together.  The incision in your abdomen will be closed with stitches (sutures) or staples.  The incision will be covered with a bandage (dressing).  A small opening (stoma) may be created in your lower abdomen. A removable, external pouch (ostomy pouch) will be attached to the stoma. This pouch will collect stool outside of your body. Stool passes through the stoma and into the pouch instead of through your anus. The procedure may vary among health care providers and hospitals. What happens after the procedure?  Your blood pressure, heart rate, breathing rate, and blood oxygen level will be monitored until  the medicines you were given have worn off.  You may continue to receive fluids and medicines through an IV tube.  You will start on a clear liquid diet and gradually go back to a normal diet.  Do not drive until your health care provider approves.  You may have some pain in your abdomen. You will be given pain medicine to control the pain.  You will be encouraged to do the following:  Do breathing exercises to prevent pneumonia.  Get up and start walking within a day after surgery. You should try to get up 5-6 times a day. This information is not intended to replace advice given to you by your health care provider. Make sure you discuss any questions you have with your health care provider. Document Released: 04/26/2009 Document Revised: 03/30/2016 Document Reviewed: 03/30/2016 Elsevier Interactive Patient Education  2017 [place].  Open Colectomy, Care After This sheet gives you information about how to care for  yourself after your procedure. Your health care provider may also give you more specific instructions. If you have problems or questions, contact your health care provider. What can I expect after the procedure? After the procedure, it is common to have:  Pain in your abdomen, especially along your incision.  Tiredness. Your energy level will return to normal over the next several weeks.  Constipation.  Nausea.  Difficulty urinating. Follow these instructions at home: Activity   You may be able to return to most of your normal activities within 1-2 weeks, such as working, walking up stairs, and sexual activity.  Avoid activities that require a lot of energy for 4-6 weeks after surgery, such as running, climbing, and lifting heavy objects. Ask your health care provider what activities are safe for you.  Take rest breaks during the day as needed.  Do not drive for 1-2 weeks or until your health care provider says that it is safe.  Do not drive or use heavy machinery while taking prescription pain medicines.  Do not lift anything that is heavier than 10 lb (4.3 kg) until your health care provider says that it is safe. Incision care   Follow instructions from your health care provider about how to take care of your incision. Make sure you:  Wash your hands with soap and water before you change your bandage (dressing). If soap and water are not available, use hand sanitizer.  Change your dressing as told by your health care provider.  Leave stitches (sutures) or staples in place. These skin closures may need to stay in place for 2 weeks or longer.  Avoid wearing tight clothing around your incision.  Protect your incision area from the sun.  Check your incision area every day for signs of infection. Check for:  More redness, swelling, or pain.  More fluid or blood.  Warmth.  Pus or a bad smell. General instructions   Do not take baths, swim, or use a hot tub until your  health care provider approves. Ask your health care provider when you may shower.  Take over-the-counter and prescription medicines, including stool softeners, only as told by your health care provider.  Eat a low-fat and low-fiber diet for the first 4 weeks after surgery.  Keep all follow-up visits as told by your health care provider. This is important. Contact a health care provider if:  You have more redness, swelling, or pain around your incision.  You have more fluid or blood coming from your incision.  Your incision feels  warm to the touch.  You have pus or a bad smell coming from your incision.  You have a fever or chills.  You do not have a bowel movement 2-3 days after surgery.  You cannot eat or drink for 24 hours or more.  You have persistent nausea and vomiting.  You have abdominal pain that gets worse and does not get better with medicine. Get help right away if:  You have chest pain.  You have shortness of breath.  You have pain or swelling in your legs.  Your incision breaks open after your sutures or staples have been removed.  You have bleeding from the rectum. This information is not intended to replace advice given to you by your health care provider. Make sure you discuss any questions you have with your health care provider. Document Released: 01/20/2011 Document Revised: 03/30/2016 Document Reviewed: 03/30/2016 Elsevier Interactive Patient Education  2017 [place] Anesthesia, Adult General anesthesia is the use of medicines to make a person "go to sleep" (be unconscious) for a medical procedure. General anesthesia is often recommended when a procedure:  Is long.  Requires you to be still or in an unusual position.  Is major and can cause you to lose blood.  Is impossible to do without general anesthesia. The medicines used for general anesthesia are called general anesthetics. In addition to making you sleep, the  medicines:  Prevent pain.  Control your blood pressure.  Relax your muscles. Tell a health care provider about:  Any allergies you have.  All medicines you are taking, including vitamins, herbs, eye drops, creams, and over-the-counter medicines.  Any problems you or family members have had with anesthetic medicines.  Types of anesthetics you have had in the past.  Any bleeding disorders you have.  Any surgeries you have had.  Any medical conditions you have.  Any history of heart or lung conditions, such as heart failure, sleep apnea, or chronic obstructive pulmonary disease (COPD).  Whether you are pregnant or may be pregnant.  Whether you use tobacco, alcohol, marijuana, or street drugs.  Any history of Armed forces logistics/support/administrative officer.  Any history of depression or anxiety. What are the risks? Generally, this is a safe procedure. However, problems may occur, including:  Allergic reaction to anesthetics.  Lung and heart problems.  Inhaling food or liquids from your stomach into your lungs (aspiration).  Injury to nerves.  Waking up during your procedure and being unable to move (rare).  Extreme agitation or a state of mental confusion (delirium) when you wake up from the anesthetic.  Air in the bloodstream, which can lead to stroke. These problems are more likely to develop if you are having a major surgery or if you have an advanced medical condition. You can prevent some of these complications by answering all of your health care provider's questions thoroughly and by following all pre-procedure instructions. General anesthesia can cause side effects, including:  Nausea or vomiting  A sore throat from the breathing tube.  Feeling cold or shivery.  Feeling tired, washed out, or achy.  Sleepiness or drowsiness.  Confusion or agitation. What happens before the procedure? Staying hydrated  Follow instructions from your health care provider about hydration, which may  include:  Up to 2 hours before the procedure - you may continue to drink clear liquids, such as water, clear fruit juice, black coffee, and plain tea. Eating and drinking restrictions  Follow instructions from your health care provider about eating and drinking,  which may include:  8 hours before the procedure - stop eating heavy meals or foods such as meat, fried foods, or fatty foods.  6 hours before the procedure - stop eating light meals or foods, such as toast or cereal.  6 hours before the procedure - stop drinking milk or drinks that contain milk.  2 hours before the procedure - stop drinking clear liquids. Medicines   Ask your health care provider about:  Changing or stopping your regular medicines. This is especially important if you are taking diabetes medicines or blood thinners.  Taking medicines such as aspirin and ibuprofen. These medicines can thin your blood. Do not take these medicines before your procedure if your health care provider instructs you not to.  Taking new dietary supplements or medicines. Do not take these during the week before your procedure unless your health care provider approves them.  If you are told to take a medicine or to continue taking a medicine on the day of the procedure, take the medicine with sips of water. General instructions    Ask if you will be going home the same day, the following day, or after a longer hospital stay.  Plan to have someone take you home.  Plan to have someone stay with you for the first 24 hours after you leave the hospital or clinic.  For 3-6 weeks before the procedure, try not to use any tobacco products, such as cigarettes, chewing tobacco, and e-cigarettes.  You may brush your teeth on the morning of the procedure, but make sure to spit out the toothpaste. What happens during the procedure?  You will be given anesthetics through a mask and through an IV tube in one of your veins.  You may receive  medicine to help you relax (sedative).  As soon as you are asleep, a breathing tube may be used to help you breathe.  An anesthesia specialist will stay with you throughout the procedure. He or she will help keep you comfortable and safe by continuing to give you medicines and adjusting the amount of medicine that you get. He or she will also watch your blood pressure, pulse, and oxygen levels to make sure that the anesthetics do not cause any problems.  If a breathing tube was used to help you breathe, it will be removed before you wake up. The procedure may vary among health care providers and hospitals. What happens after the procedure?  You will wake up, often slowly, after the procedure is complete, usually in a recovery area.  Your blood pressure, heart rate, breathing rate, and blood oxygen level will be monitored until the medicines you were given have worn off.  You may be given medicine to help you calm down if you feel anxious or agitated.  If you will be going home the same day, your health care provider may check to make sure you can stand, drink, and urinate.  Your health care providers will treat your pain and side effects before you go home.  Do not drive for 24 hours if you received a sedative.  You may:  Feel nauseous and vomit.  Have a sore throat.  Have mental slowness.  Feel cold or shivery.  Feel sleepy.  Feel tired.  Feel sore or achy, even in parts of your body where you did not have surgery. This information is not intended to replace advice given to you by your health care provider. Make sure you discuss any questions you have with  your health care provider. Document Released: 10/06/2007 Document Revised: 12/10/2015 Document Reviewed: 06/13/2015 Elsevier Interactive Patient Education  2017 [place] Anesthesia, Adult, Care After These instructions provide you with information about caring for yourself after your procedure. Your health  care provider may also give you more specific instructions. Your treatment has been planned according to current medical practices, but problems sometimes occur. Call your health care provider if you have any problems or questions after your procedure. What can I expect after the procedure? After the procedure, it is common to have:  Vomiting.  A sore throat.  Mental slowness. It is common to feel:  Nauseous.  Cold or shivery.  Sleepy.  Tired.  Sore or achy, even in parts of your body where you did not have surgery. Follow these instructions at home: For at least 24 hours after the procedure:   Do not:  Participate in activities where you could fall or become injured.  Drive.  Use heavy machinery.  Drink alcohol.  Take sleeping pills or medicines that cause drowsiness.  Make important decisions or sign legal documents.  Take care of children on your own.  Rest. Eating and drinking   If you vomit, drink water, juice, or soup when you can drink without vomiting.  Drink enough fluid to keep your urine clear or pale yellow.  Make sure you have little or no nausea before eating solid foods.  Follow the diet recommended by your health care provider. General instructions   Have a responsible adult stay with you until you are awake and alert.  Return to your normal activities as told by your health care provider. Ask your health care provider what activities are safe for you.  Take over-the-counter and prescription medicines only as told by your health care provider.  If you smoke, do not smoke without supervision.  Keep all follow-up visits as told by your health care provider. This is important. Contact a health care provider if:  You continue to have nausea or vomiting at home, and medicines are not helpful.  You cannot drink fluids or start eating again.  You cannot urinate after 8-12 hours.  You develop a skin rash.  You have fever.  You have  increasing redness at the site of your procedure. Get help right away if:  You have difficulty breathing.  You have chest pain.  You have unexpected bleeding.  You feel that you are having a life-threatening or urgent problem. This information is not intended to replace advice given to you by your health care provider. Make sure you discuss any questions you have with your health care provider. Document Released: 10/05/2000 Document Revised: 12/02/2015 Document Reviewed: 06/13/2015 Elsevier Interactive Patient Education  2017 [employer].

## 2016-09-21 ENCOUNTER — Encounter (HOSPITAL_COMMUNITY): Payer: Self-pay

## 2016-09-21 ENCOUNTER — Encounter (HOSPITAL_COMMUNITY)
Admission: RE | Admit: 2016-09-21 | Discharge: 2016-09-21 | Disposition: A | Discharge status: Home / Self Care | Payer: BC Managed Care – PPO | Source: Ambulatory Visit | Attending: General Surgery | Admitting: General Surgery

## 2016-09-21 ENCOUNTER — Ambulatory Visit (HOSPITAL_COMMUNITY)
Admission: RE | Admit: 2016-09-21 | Discharge: 2016-09-21 | Disposition: A | Discharge status: Home / Self Care | Payer: BC Managed Care – PPO | Source: Ambulatory Visit | Attending: General Surgery | Admitting: General Surgery

## 2016-09-21 DIAGNOSIS — R918 Other nonspecific abnormal finding of lung field: Secondary | ICD-10-CM | POA: Insufficient documentation

## 2016-09-21 DIAGNOSIS — I4510 Unspecified right bundle-branch block: Secondary | ICD-10-CM | POA: Insufficient documentation

## 2016-09-21 DIAGNOSIS — J9811 Atelectasis: Secondary | ICD-10-CM | POA: Diagnosis not present

## 2016-09-21 DIAGNOSIS — Z01810 Encounter for preprocedural cardiovascular examination: Secondary | ICD-10-CM | POA: Insufficient documentation

## 2016-09-21 DIAGNOSIS — C189 Malignant neoplasm of colon, unspecified: Secondary | ICD-10-CM | POA: Insufficient documentation

## 2016-09-21 LAB — COMPREHENSIVE METABOLIC PANEL
ALBUMIN: 4.4 g/dL (ref 3.5–5.0)
ALK PHOS: 64 U/L (ref 38–126)
ALT: 46 U/L (ref 17–63)
ANION GAP: 15 (ref 5–15)
AST: 30 U/L (ref 15–41)
BUN: 16 mg/dL (ref 6–20)
CALCIUM: 9.1 mg/dL (ref 8.9–10.3)
CO2: 21 mmol/L — AB (ref 22–32)
Chloride: 105 mmol/L (ref 101–111)
Creatinine, Ser: 1.07 mg/dL (ref 0.61–1.24)
GFR calc Af Amer: >60 mL/min (ref >60)
GFR calc non Af Amer: >60 mL/min (ref >60)
GLUCOSE: 97 mg/dL (ref 65–99)
POTASSIUM: 3.8 mmol/L (ref 3.5–5.1)
SODIUM: 141 mmol/L (ref 135–145)
Total Bilirubin: 0.6 mg/dL (ref 0.3–1.2)
Total Protein: 7.4 g/dL (ref 6.5–8.1)

## 2016-09-21 LAB — CBC WITH DIFFERENTIAL/PLATELET
BASOS ABS: 0 10*3/uL (ref 0.0–0.1)
BASOS PCT: 1 %
EOS ABS: 0.2 10*3/uL (ref 0.0–0.7)
Eosinophils Relative: 3 %
HCT: 46.3 % (ref 39.0–52.0)
HEMOGLOBIN: 16 g/dL (ref 13.0–17.0)
Lymphocytes Relative: 26 %
Lymphs Abs: 1.5 10*3/uL (ref 0.7–4.0)
MCH: 31.1 pg (ref 26.0–34.0)
MCHC: 34.6 g/dL (ref 30.0–36.0)
MCV: 89.9 fL (ref 78.0–100.0)
MONOS PCT: 9 %
Monocytes Absolute: 0.5 10*3/uL (ref 0.1–1.0)
NEUTROS PCT: 61 %
Neutro Abs: 3.6 10*3/uL (ref 1.7–7.7)
Platelets: 287 10*3/uL (ref 150–400)
RBC: 5.15 MIL/uL (ref 4.22–5.81)
RDW: 12.4 % (ref 11.5–15.5)
WBC: 5.7 10*3/uL (ref 4.0–10.5)

## 2016-09-22 LAB — CEA: CEA: 1.6 ng/mL (ref 0.0–4.7)

## 2016-09-23 ENCOUNTER — Encounter (HOSPITAL_COMMUNITY)
Admission: RE | Disposition: A | Discharge status: Home / Self Care | Payer: Self-pay | Source: Ambulatory Visit | Attending: General Surgery

## 2016-09-23 ENCOUNTER — Encounter (HOSPITAL_COMMUNITY): Payer: Self-pay | Admitting: *Deleted

## 2016-09-23 ENCOUNTER — Inpatient Hospital Stay (HOSPITAL_COMMUNITY): Payer: BC Managed Care – PPO | Admitting: Anesthesiology

## 2016-09-23 ENCOUNTER — Inpatient Hospital Stay (HOSPITAL_COMMUNITY)
Admission: RE | Admit: 2016-09-23 | Discharge: 2016-09-25 | DRG: 331 | Disposition: A | Discharge status: Home / Self Care | Payer: BC Managed Care – PPO | Source: Ambulatory Visit | Attending: General Surgery | Admitting: General Surgery

## 2016-09-23 DIAGNOSIS — C189 Malignant neoplasm of colon, unspecified: Secondary | ICD-10-CM | POA: Diagnosis present

## 2016-09-23 DIAGNOSIS — C187 Malignant neoplasm of sigmoid colon: Secondary | ICD-10-CM | POA: Diagnosis present

## 2016-09-23 DIAGNOSIS — Z8249 Family history of ischemic heart disease and other diseases of the circulatory system: Secondary | ICD-10-CM | POA: Diagnosis not present

## 2016-09-23 DIAGNOSIS — E785 Hyperlipidemia, unspecified: Secondary | ICD-10-CM | POA: Diagnosis present

## 2016-09-23 HISTORY — PX: PARTIAL COLECTOMY: SHX5273

## 2016-09-23 HISTORY — DX: Malignant neoplasm of colon, unspecified: C18.9

## 2016-09-23 LAB — GLUCOSE, CAPILLARY: Glucose-Capillary: 128 mg/dL — ABNORMAL HIGH (ref 65–99)

## 2016-09-23 SURGERY — COLECTOMY, PARTIAL
Anesthesia: General

## 2016-09-23 MED ORDER — FENTANYL CITRATE (PF) 100 MCG/2ML IJ SOLN
INTRAMUSCULAR | Status: DC | PRN
  Administered 2016-09-23 (×10): 50 ug via INTRAVENOUS

## 2016-09-23 MED ORDER — BUPIVACAINE LIPOSOME 1.3 % IJ SUSP
INTRAMUSCULAR | Status: DC | PRN
  Administered 2016-09-23: 20 mL

## 2016-09-23 MED ORDER — DEXAMETHASONE SODIUM PHOSPHATE 4 MG/ML IJ SOLN
4.0000 mg | Freq: Once | INTRAMUSCULAR | Status: AC
  Administered 2016-09-23: 4 mg via INTRAVENOUS

## 2016-09-23 MED ORDER — LIDOCAINE HCL (CARDIAC) 10 MG/ML IV SOLN
INTRAVENOUS | Status: DC | PRN
  Administered 2016-09-23: 50 mg via INTRAVENOUS

## 2016-09-23 MED ORDER — LACTATED RINGERS IV SOLN
INTRAVENOUS | Status: DC | PRN
  Administered 2016-09-23 (×2): via INTRAVENOUS

## 2016-09-23 MED ORDER — ENOXAPARIN SODIUM 40 MG/0.4ML ~~LOC~~ SOLN
40.0000 mg | Freq: Once | SUBCUTANEOUS | Status: AC
  Administered 2016-09-23: 40 mg via SUBCUTANEOUS
  Filled 2016-09-23: qty 0.4

## 2016-09-23 MED ORDER — DIPHENHYDRAMINE HCL 50 MG/ML IJ SOLN: 25.0000 mg | Freq: Four times a day (QID) | INTRAMUSCULAR | Status: DC | PRN

## 2016-09-23 MED ORDER — POVIDONE-IODINE 10 % EX OINT
TOPICAL_OINTMENT | CUTANEOUS | Status: AC
  Filled 2016-09-23: qty 1

## 2016-09-23 MED ORDER — ONDANSETRON HCL 4 MG/2ML IJ SOLN: 4.0000 mg | Freq: Four times a day (QID) | INTRAMUSCULAR | Status: DC | PRN

## 2016-09-23 MED ORDER — SODIUM CHLORIDE 0.9 % IV SOLN
1.0000 g | INTRAVENOUS | Status: AC
  Administered 2016-09-23: 1 g via INTRAVENOUS
  Filled 2016-09-23: qty 1

## 2016-09-23 MED ORDER — LIDOCAINE HCL (PF) 1 % IJ SOLN
INTRAMUSCULAR | Status: AC
  Filled 2016-09-23: qty 5

## 2016-09-23 MED ORDER — ONDANSETRON HCL 4 MG/2ML IJ SOLN
INTRAMUSCULAR | Status: AC
  Filled 2016-09-23: qty 2

## 2016-09-23 MED ORDER — KETOROLAC TROMETHAMINE 30 MG/ML IJ SOLN
30.0000 mg | Freq: Four times a day (QID) | INTRAMUSCULAR | Status: DC
  Administered 2016-09-23 – 2016-09-25 (×8): 30 mg via INTRAVENOUS
  Filled 2016-09-23 (×7): qty 1

## 2016-09-23 MED ORDER — ENOXAPARIN SODIUM 40 MG/0.4ML ~~LOC~~ SOLN
40.0000 mg | SUBCUTANEOUS | Status: DC
  Administered 2016-09-24 – 2016-09-25 (×2): 40 mg via SUBCUTANEOUS
  Filled 2016-09-23 (×2): qty 0.4

## 2016-09-23 MED ORDER — CHLORHEXIDINE GLUCONATE CLOTH 2 % EX PADS: 6.0000 | MEDICATED_PAD | Freq: Once | CUTANEOUS | Status: DC

## 2016-09-23 MED ORDER — LACTATED RINGERS IV SOLN: INTRAVENOUS | Status: DC

## 2016-09-23 MED ORDER — LORAZEPAM 2 MG/ML IJ SOLN: 1.0000 mg | INTRAMUSCULAR | Status: DC | PRN

## 2016-09-23 MED ORDER — SUCCINYLCHOLINE CHLORIDE 20 MG/ML IJ SOLN
INTRAMUSCULAR | Status: AC
  Filled 2016-09-23: qty 1

## 2016-09-23 MED ORDER — DEXAMETHASONE SODIUM PHOSPHATE 4 MG/ML IJ SOLN
INTRAMUSCULAR | Status: AC
  Filled 2016-09-23: qty 1

## 2016-09-23 MED ORDER — 0.9 % SODIUM CHLORIDE (POUR BTL) OPTIME
TOPICAL | Status: DC | PRN
  Administered 2016-09-23: 1000 mL

## 2016-09-23 MED ORDER — ACETAMINOPHEN 650 MG RE SUPP
650.0000 mg | Freq: Four times a day (QID) | RECTAL | Status: DC | PRN
  Filled 2016-09-23: qty 1

## 2016-09-23 MED ORDER — NEOSTIGMINE METHYLSULFATE 10 MG/10ML IV SOLN
INTRAVENOUS | Status: DC | PRN
  Administered 2016-09-23: 3 mg via INTRAVENOUS

## 2016-09-23 MED ORDER — MIDAZOLAM HCL 2 MG/2ML IJ SOLN
INTRAMUSCULAR | Status: AC
  Filled 2016-09-23: qty 2

## 2016-09-23 MED ORDER — FENTANYL CITRATE (PF) 250 MCG/5ML IJ SOLN
INTRAMUSCULAR | Status: AC
  Filled 2016-09-23: qty 5

## 2016-09-23 MED ORDER — PROPOFOL 10 MG/ML IV BOLUS
INTRAVENOUS | Status: AC
  Filled 2016-09-23: qty 20

## 2016-09-23 MED ORDER — PROPOFOL 10 MG/ML IV BOLUS
INTRAVENOUS | Status: DC | PRN
  Administered 2016-09-23: 200 mg via INTRAVENOUS

## 2016-09-23 MED ORDER — ALVIMOPAN 12 MG PO CAPS
12.0000 mg | ORAL_CAPSULE | Freq: Two times a day (BID) | ORAL | Status: DC
  Administered 2016-09-24: 12 mg via ORAL
  Filled 2016-09-23: qty 1

## 2016-09-23 MED ORDER — ALVIMOPAN 12 MG PO CAPS
12.0000 mg | ORAL_CAPSULE | Freq: Once | ORAL | Status: AC
  Administered 2016-09-23: 12 mg via ORAL
  Filled 2016-09-23: qty 1

## 2016-09-23 MED ORDER — LACTATED RINGERS IV SOLN
INTRAVENOUS | Status: DC
  Administered 2016-09-23 – 2016-09-24 (×3): via INTRAVENOUS

## 2016-09-23 MED ORDER — SIMVASTATIN 20 MG PO TABS
20.0000 mg | ORAL_TABLET | Freq: Every day | ORAL | Status: DC
  Administered 2016-09-23 – 2016-09-24 (×2): 20 mg via ORAL
  Filled 2016-09-23 (×3): qty 1

## 2016-09-23 MED ORDER — MIDAZOLAM HCL 2 MG/2ML IJ SOLN
1.0000 mg | INTRAMUSCULAR | Status: AC
  Administered 2016-09-23 (×2): 2 mg via INTRAVENOUS
  Filled 2016-09-23: qty 2

## 2016-09-23 MED ORDER — HYDROMORPHONE HCL 1 MG/ML IJ SOLN
INTRAMUSCULAR | Status: AC
  Filled 2016-09-23: qty 1

## 2016-09-23 MED ORDER — SIMETHICONE 80 MG PO CHEW
40.0000 mg | CHEWABLE_TABLET | Freq: Four times a day (QID) | ORAL | Status: DC | PRN
  Filled 2016-09-23: qty 1

## 2016-09-23 MED ORDER — GLYCOPYRROLATE 0.2 MG/ML IJ SOLN
INTRAMUSCULAR | Status: DC | PRN
  Administered 2016-09-23: .5 mg via INTRAVENOUS

## 2016-09-23 MED ORDER — BUPIVACAINE LIPOSOME 1.3 % IJ SUSP
INTRAMUSCULAR | Status: AC
  Filled 2016-09-23: qty 20

## 2016-09-23 MED ORDER — ROCURONIUM 10MG/ML (10ML) SYRINGE FOR MEDFUSION PUMP - OPTIME
INTRAVENOUS | Status: DC | PRN
  Administered 2016-09-23: 35 mg via INTRAVENOUS
  Administered 2016-09-23: 15 mg via INTRAVENOUS

## 2016-09-23 MED ORDER — ONDANSETRON HCL 4 MG/2ML IJ SOLN
4.0000 mg | Freq: Once | INTRAMUSCULAR | Status: AC
  Administered 2016-09-23: 4 mg via INTRAVENOUS

## 2016-09-23 MED ORDER — OXYCODONE-ACETAMINOPHEN 5-325 MG PO TABS
1.0000 | ORAL_TABLET | ORAL | Status: DC | PRN
  Administered 2016-09-23: 2 via ORAL
  Administered 2016-09-24: 1 via ORAL
  Filled 2016-09-23: qty 1
  Filled 2016-09-23: qty 2

## 2016-09-23 MED ORDER — HYDROMORPHONE HCL 1 MG/ML IJ SOLN
1.0000 mg | INTRAMUSCULAR | Status: DC | PRN
  Administered 2016-09-23: 1 mg via INTRAVENOUS
  Filled 2016-09-23: qty 1

## 2016-09-23 MED ORDER — ONDANSETRON 4 MG PO TBDP: 4.0000 mg | ORAL_TABLET | Freq: Four times a day (QID) | ORAL | Status: DC | PRN

## 2016-09-23 MED ORDER — ACETAMINOPHEN 325 MG PO TABS: 650.0000 mg | ORAL_TABLET | Freq: Four times a day (QID) | ORAL | Status: DC | PRN

## 2016-09-23 MED ORDER — ROCURONIUM BROMIDE 50 MG/5ML IV SOLN
INTRAVENOUS | Status: AC
  Filled 2016-09-23: qty 1

## 2016-09-23 MED ORDER — HYDROMORPHONE HCL 1 MG/ML IJ SOLN
0.2500 mg | INTRAMUSCULAR | Status: DC | PRN
  Administered 2016-09-23 (×4): 0.5 mg via INTRAVENOUS
  Filled 2016-09-23 (×2): qty 0.5

## 2016-09-23 MED ORDER — KETOROLAC TROMETHAMINE 30 MG/ML IJ SOLN: 30.0000 mg | Freq: Four times a day (QID) | INTRAMUSCULAR | Status: DC | PRN

## 2016-09-23 MED ORDER — KETOROLAC TROMETHAMINE 30 MG/ML IJ SOLN
INTRAMUSCULAR | Status: AC
  Filled 2016-09-23: qty 1

## 2016-09-23 MED ORDER — POVIDONE-IODINE 10 % OINT PACKET
TOPICAL_OINTMENT | CUTANEOUS | Status: DC | PRN
  Administered 2016-09-23: 1 via TOPICAL

## 2016-09-23 MED ORDER — DIPHENHYDRAMINE HCL 25 MG PO CAPS: 25.0000 mg | ORAL_CAPSULE | Freq: Four times a day (QID) | ORAL | Status: DC | PRN

## 2016-09-23 SURGICAL SUPPLY — 69 items
APPLIER CLIP 11 MED OPEN (CLIP)
APPLIER CLIP 13 LRG OPEN (CLIP)
BAG HAMPER (MISCELLANEOUS) ×3 IMPLANT
BARRIER SKIN 2 3/4 (OSTOMY) IMPLANT
BARRIER SKIN 2 3/4 INCH (OSTOMY)
CELLS DAT CNTRL 66122 CELL SVR (MISCELLANEOUS) IMPLANT
CHLORAPREP W/TINT 26ML (MISCELLANEOUS) ×3 IMPLANT
CLAMP POUCH DRAINAGE QUIET (OSTOMY) IMPLANT
CLIP APPLIE 11 MED OPEN (CLIP) IMPLANT
CLIP APPLIE 13 LRG OPEN (CLIP) IMPLANT
CLOTH BEACON ORANGE TIMEOUT ST (SAFETY) ×3 IMPLANT
COVER LIGHT HANDLE STERIS (MISCELLANEOUS) ×6 IMPLANT
COVER MAYO STAND XLG (DRAPE) ×3 IMPLANT
DRAPE UTILITY W/TAPE 26X15 (DRAPES) ×6 IMPLANT
DRAPE WARM FLUID 44X44 (DRAPE) ×3 IMPLANT
DRSG OPSITE POSTOP 4X10 (GAUZE/BANDAGES/DRESSINGS) IMPLANT
DRSG OPSITE POSTOP 4X12 (GAUZE/BANDAGES/DRESSINGS) ×3 IMPLANT
DRSG OPSITE POSTOP 4X8 (GAUZE/BANDAGES/DRESSINGS) IMPLANT
ELECT BLADE 6 FLAT ULTRCLN (ELECTRODE) IMPLANT
ELECT REM PT RETURN 9FT ADLT (ELECTROSURGICAL) ×3
ELECTRODE REM PT RTRN 9FT ADLT (ELECTROSURGICAL) ×1 IMPLANT
FORMALIN 10 PREFIL 480ML (MISCELLANEOUS) IMPLANT
GLOVE BIOGEL PI IND STRL 7.0 (GLOVE) ×4 IMPLANT
GLOVE BIOGEL PI INDICATOR 7.0 (GLOVE) ×8
GLOVE SURG SS PI 7.5 STRL IVOR (GLOVE) ×6 IMPLANT
GOWN STRL REUS W/ TWL XL LVL3 (GOWN DISPOSABLE) ×2 IMPLANT
GOWN STRL REUS W/TWL LRG LVL3 (GOWN DISPOSABLE) ×12 IMPLANT
GOWN STRL REUS W/TWL XL LVL3 (GOWN DISPOSABLE) ×4
HANDLE SUCTION POOLE (INSTRUMENTS) ×1 IMPLANT
INST SET MAJOR GENERAL (KITS) ×3 IMPLANT
KIT BLADEGUARD II DBL (SET/KITS/TRAYS/PACK) ×3 IMPLANT
KIT ROOM TURNOVER APOR (KITS) ×3 IMPLANT
LIGASURE IMPACT 36 18CM CVD LR (INSTRUMENTS) ×3 IMPLANT
MANIFOLD NEPTUNE II (INSTRUMENTS) ×3 IMPLANT
NEEDLE HYPO 18GX1.5 BLUNT FILL (NEEDLE) ×3 IMPLANT
NEEDLE HYPO 21X1.5 SAFETY (NEEDLE) ×3 IMPLANT
NS IRRIG 1000ML POUR BTL (IV SOLUTION) ×6 IMPLANT
PACK ABDOMINAL MAJOR (CUSTOM PROCEDURE TRAY) ×3 IMPLANT
PAD ARMBOARD 7.5X6 YLW CONV (MISCELLANEOUS) ×3 IMPLANT
PENCIL HANDSWITCHING (ELECTRODE) ×3 IMPLANT
POUCH OSTOMY 2 3/4  H 3804 (WOUND CARE)
POUCH OSTOMY 2 PC DRNBL 2.25 (WOUND CARE) IMPLANT
POUCH OSTOMY 2 PC DRNBL 2.75 (WOUND CARE) IMPLANT
POUCH OSTOMY DRNBL 2 1/4 (WOUND CARE)
RELOAD LINEAR CUT PROX 55 BLUE (ENDOMECHANICALS) IMPLANT
RELOAD PROXIMATE 75MM BLUE (ENDOMECHANICALS) IMPLANT
RETRACTOR WND ALEXIS 25 LRG (MISCELLANEOUS) ×1 IMPLANT
RTRCTR WOUND ALEXIS 18CM MED (MISCELLANEOUS)
RTRCTR WOUND ALEXIS 25CM LRG (MISCELLANEOUS) ×3
SET BASIN LINEN APH (SET/KITS/TRAYS/PACK) ×3 IMPLANT
SPONGE LAP 18X18 X RAY DECT (DISPOSABLE) ×3 IMPLANT
STAPLER GUN LINEAR PROX 60 (STAPLE) ×3 IMPLANT
STAPLER PROXIMATE 55 BLUE (STAPLE) IMPLANT
STAPLER PROXIMATE 75MM BLUE (STAPLE) ×6 IMPLANT
STAPLER VISISTAT (STAPLE) ×3 IMPLANT
SUCTION POOLE HANDLE (INSTRUMENTS) ×3
SUCTION YANKAUER HANDLE (MISCELLANEOUS) ×3 IMPLANT
SUT CHROMIC 0 SH (SUTURE) IMPLANT
SUT CHROMIC 2 0 SH (SUTURE) IMPLANT
SUT CHROMIC 3 0 SH 27 (SUTURE) IMPLANT
SUT NOVA NAB GS-26 0 60 (SUTURE) IMPLANT
SUT PDS AB 0 CTX 60 (SUTURE) ×6 IMPLANT
SUT SILK 2 0 (SUTURE)
SUT SILK 2 0 REEL (SUTURE) IMPLANT
SUT SILK 2-0 18XBRD TIE 12 (SUTURE) IMPLANT
SUT SILK 3 0 SH CR/8 (SUTURE) ×3 IMPLANT
SYR 20CC LL (SYRINGE) ×3 IMPLANT
TOWEL OR 17X26 4PK STRL BLUE (TOWEL DISPOSABLE) ×3 IMPLANT
TRAY FOLEY CATH SILVER 16FR (SET/KITS/TRAYS/PACK) ×3 IMPLANT

## 2016-09-23 NOTE — Anesthesia Preprocedure Evaluation (Signed)
Anesthesia Evaluation  Patient identified by MRN, date of birth, ID band Patient awake    Reviewed: Allergy & Precautions, NPO status , Patient's Chart, lab work & pertinent test results  Airway Mallampati: I  TM Distance: >3 FB     Dental  (+) Teeth Intact   Pulmonary neg pulmonary ROS,    breath sounds clear to auscultation       Cardiovascular negative cardio ROS   Rhythm:Regular Rate:Normal     Neuro/Psych negative neurological ROS  negative psych ROS   GI/Hepatic negative GI ROS, (+)     substance abuse  alcohol use,   Endo/Other  diabetes (pre DM)  Renal/GU      Musculoskeletal   Abdominal   Peds  Hematology   Anesthesia Other Findings   Reproductive/Obstetrics                             Anesthesia Physical Anesthesia Plan  ASA: II  Anesthesia Plan: General   Post-op Pain Management:    Induction: Intravenous  Airway Management Planned: Oral ETT  Additional Equipment:   Intra-op Plan:   Post-operative Plan: Extubation in OR  Informed Consent: I have reviewed the patients History and Physical, chart, labs and discussed the procedure including the risks, benefits and alternatives for the proposed anesthesia with the patient or authorized representative who has indicated his/her understanding and acceptance.     Plan Discussed with:   Anesthesia Plan Comments:         Anesthesia Quick Evaluation

## 2016-09-23 NOTE — Interval H&P Note (Signed)
History and Physical Interval Note:  09/23/2016 10:37 AM  Scott Meyers  has presented today for surgery, with the diagnosis of colon cancer  The various methods of treatment have been discussed with the patient and family. After consideration of risks, benefits and other options for treatment, the patient has consented to  Procedure(s): PARTIAL COLECTOMY (N/A) as a surgical intervention .  The patient's history has been reviewed, patient examined, no change in status, stable for surgery.  I have reviewed the patient's chart and labs.  Questions were answered to the patient's satisfaction.     Aviva Signs

## 2016-09-23 NOTE — Anesthesia Postprocedure Evaluation (Signed)
Anesthesia Post Note  Patient: Scott Meyers  Procedure(s) Performed: Procedure(s) (LRB): PARTIAL COLECTOMY (N/A)  Patient location during evaluation: PACU Anesthesia Type: General Level of consciousness: awake and alert and oriented Pain management: pain level controlled Vital Signs Assessment: post-procedure vital signs reviewed and stable Respiratory status: spontaneous breathing Cardiovascular status: blood pressure returned to baseline Postop Assessment: no signs of nausea or vomiting Anesthetic complications: no     Last Vitals:  Vitals:   09/23/16 1345 09/23/16 1400  BP:  (!) 144/83  Pulse: 64 67  Resp: 12 15  Temp:      Last Pain:  Vitals:   09/23/16 1343  TempSrc:   PainSc: 7                  Reshonda Koerber

## 2016-09-23 NOTE — Anesthesia Procedure Notes (Signed)
Procedure Name: Intubation Date/Time: 09/23/2016 11:23 AM Performed by: Tressie Stalker E Pre-anesthesia Checklist: Patient identified, Patient being monitored, Timeout performed, Emergency Drugs available and Suction available Patient Re-evaluated:Patient Re-evaluated prior to inductionOxygen Delivery Method: Circle system utilized Preoxygenation: Pre-oxygenation with 100% oxygen Intubation Type: IV induction Ventilation: Mask ventilation without difficulty Laryngoscope Size: Mac and 3 Grade View: Grade I Tube type: Oral Tube size: 7.0 mm Number of attempts: 1 Airway Equipment and Method: Stylet Placement Confirmation: ETT inserted through vocal cords under direct vision,  positive ETCO2 and breath sounds checked- equal and bilateral Secured at: 22 cm Tube secured with: Tape Dental Injury: Teeth and Oropharynx as per pre-operative assessment

## 2016-09-23 NOTE — Op Note (Signed)
Patient:  Scott Meyers  DOB:  11/04/1965  MRN:  [id number]   Preop Diagnosis:  Colon carcinoma  Postop Diagnosis:  Same  Procedure:  Partial colectomy  Surgeon:  Aviva Signs, M.D.  Anes:  Gen. endotracheal  Indications:  Patient is a 51 year old white male who was found on screening colonoscopy to have an invasive carcinoma in a polyp removed. It was in the mid sigmoid colon region. The patient now presents for a partial colectomy. The risks and benefits of the procedure including bleeding, infection, cardiopulmonary difficulties, anastomotic leak, and the possibility of a blood transfusion were fully explained to the patient, who gave informed consent.  Procedure note:  The patient was placed in the supine position. After induction of general endotracheal anesthesia, the abdomen was prepped and draped using usual sterile technique with ChloraPrep. Surgical site confirmation was performed.  A lower midline incision was made into the peritoneal cavity without difficulty. The liver was activated and no palpable lesions were noted. In the sigmoid colon, the lesion was palpated towards the distal aspect. The peritoneal reflection was incised up to the proximal descending colon. Care was taken to avoid the left ureter. A GIA 70 stapler was placed distal to the lesion and fired. This was likewise done to the proximal portion of the sigmoid colon. The mesentery was divided using a LigaSure. The specimen was opened in the operating room and the suspicious region was within the specimen removed. It was in the distal part of the specimen. Adequate margins were noted. A side-to-side anastomosis of the proximal sigmoid colon to the colorectal junction was performed using a GIA-75 stapler. The colotomy was closed using a TA 60 stapler. The staple line was oversewn using 3-0 silk sutures. Surrounding adipose tissue was placed over the anastomosis. The pelvis was then copiously irrigated with normal saline.  Adequate and patent anastomosis was found. All operating personnel been changed her gown and gloves. A new setup was then used.  The fascia was reapproximated using an 0 PDS running suture. Subcutaneous layer was irrigated with normal saline. Exparel was instilled into the surrounding wound. The skin was closed using staples. Betadine ointment and dressed with dressings were applied.  All tape and needle counts were correct at the end of the procedure. The patient was extubated in the operating room and transferred to PACU in stable condition.  Complications:  None  EBL:  50 mL  Specimen:  Sigmoid colon, tumor distal

## 2016-09-23 NOTE — Transfer of Care (Signed)
Immediate Anesthesia Transfer of Care Note  Patient: Scott Meyers  Procedure(s) Performed: Procedure(s): PARTIAL COLECTOMY (N/A)  Patient Location: PACU  Anesthesia Type:General  Level of Consciousness: awake, alert  and oriented  Airway & Oxygen Therapy: Patient Spontanous Breathing and Patient connected to face mask oxygen  Post-op Assessment: Report given to RN  Post vital signs: Reviewed and stable  Last Vitals:  Vitals:   09/23/16 1045 09/23/16 1100  BP: 116/76 116/76  Resp: 16 14  Temp:      Last Pain:  Vitals:   09/23/16 0949  TempSrc: Oral      Patients Stated Pain Goal: 6 (33/35/45 6256)  Complications: No apparent anesthesia complications

## 2016-09-24 LAB — BASIC METABOLIC PANEL
Anion gap: 7 (ref 5–15)
BUN: 11 mg/dL (ref 6–20)
CALCIUM: 8.5 mg/dL — AB (ref 8.9–10.3)
CO2: 28 mmol/L (ref 22–32)
Chloride: 99 mmol/L — ABNORMAL LOW (ref 101–111)
Creatinine, Ser: 0.97 mg/dL (ref 0.61–1.24)
GFR calc Af Amer: >60 mL/min (ref >60)
GFR calc non Af Amer: >60 mL/min (ref >60)
GLUCOSE: 112 mg/dL — AB (ref 65–99)
POTASSIUM: 3.9 mmol/L (ref 3.5–5.1)
SODIUM: 134 mmol/L — AB (ref 135–145)

## 2016-09-24 LAB — PHOSPHORUS: PHOSPHORUS: 4.2 mg/dL (ref 2.5–4.6)

## 2016-09-24 LAB — CBC
HEMATOCRIT: 37.2 % — AB (ref 39.0–52.0)
Hemoglobin: 13.1 g/dL (ref 13.0–17.0)
MCH: 31.3 pg (ref 26.0–34.0)
MCHC: 35.2 g/dL (ref 30.0–36.0)
MCV: 88.8 fL (ref 78.0–100.0)
Platelets: 243 10*3/uL (ref 150–400)
RBC: 4.19 MIL/uL — ABNORMAL LOW (ref 4.22–5.81)
RDW: 12.4 % (ref 11.5–15.5)
WBC: 9.9 10*3/uL (ref 4.0–10.5)

## 2016-09-24 LAB — MAGNESIUM: Magnesium: 1.7 mg/dL (ref 1.7–2.4)

## 2016-09-24 NOTE — Progress Notes (Signed)
1 Day Post-Op  Subjective: Patient doing well. Minimal incisional pain.  Objective: Vital signs in last 24 hours: Temp:  [97.2 F (36.2 C)-98.1 F (36.7 C)] 97.6 F (36.4 C) (03/15 0603) Pulse Rate:  [59-97] 63 (03/15 0603) Resp:  [10-30] 18 (03/15 0603) BP: (116-158)/(76-110) 124/91 (03/15 0603) SpO2:  [89 %-100 %] 100 % (03/15 0603) Last BM Date: 09/23/16  Intake/Output from previous day: 03/14 0701 - 03/15 0700 In: 3730 [P.O.:180; I.V.:2550; IV Piggyback:1000] Out: 1555 [Urine:1505; Blood:50] Intake/Output this shift: Total I/O In: -  Out: 175 [Urine:175]  General appearance: alert, cooperative and no distress Resp: clear to auscultation bilaterally Cardio: regular rate and rhythm, S1, S2 normal, no murmur, click, rub or gallop GI: Soft, occasional bowel sounds appreciated. Incision healing well.  Lab Results:   Recent Labs  09/21/16 1144 09/24/16 0504  WBC 5.7 9.9  HGB 16.0 13.1  HCT 46.3 37.2*  PLT 287 243   BMET  Recent Labs  09/21/16 1144 09/24/16 0504  NA 141 134*  K 3.8 3.9  CL 105 99*  CO2 21* 28  GLUCOSE 97 112*  BUN 16 11  CREATININE 1.07 0.97  CALCIUM 9.1 8.5*   PT/INR No results for input(s): LABPROT, INR in the last 72 hours.  Studies/Results: No results found.  Anti-infectives: Anti-infectives    Start     Dose/Rate Route Frequency Ordered Stop   09/23/16 1100  ertapenem (INVANZ) 1 g in sodium chloride 0.9 % 50 mL IVPB     1 g 100 mL/hr over 30 Minutes Intravenous On call to O.R. 09/23/16 0940 09/23/16 1113      Assessment/Plan: s/p Procedure(s): PARTIAL COLECTOMY Impression: Stable on postoperative day 1. Will advance to full liquid diet. Patient is ambulating. Final pathology pending.  LOS: 1 day    Aviva Signs 09/24/2016

## 2016-09-24 NOTE — Progress Notes (Signed)
Pt. Ambulated approx. 500 ft. Tolerated well. No complaints.

## 2016-09-24 NOTE — Anesthesia Postprocedure Evaluation (Signed)
Anesthesia Post Note  Patient: Scott Meyers  Procedure(s) Performed: Procedure(s) (LRB): PARTIAL COLECTOMY (N/A)  Patient location during evaluation: Nursing Unit Anesthesia Type: General Level of consciousness: awake and alert and oriented Pain management: pain level controlled Respiratory status: spontaneous breathing Cardiovascular status: blood pressure returned to baseline Postop Assessment: no signs of nausea or vomiting and adequate PO intake (one episode of vomiting yesterday not associated with  nausea.  patient feels like he drank too much water too quickly.  ) Anesthetic complications: no     Last Vitals:  Vitals:   09/23/16 2204 09/24/16 0603  BP: 138/83 (!) 124/91  Pulse: 61 63  Resp: 20 18  Temp: 36.6 C 36.4 C    Last Pain:  Vitals:   09/24/16 0603  TempSrc: Oral  PainSc:                  Tressie Stalker

## 2016-09-24 NOTE — Addendum Note (Signed)
Addendum  created 09/24/16 0175 by Ollen Bowl, CRNA   Sign clinical note

## 2016-09-25 ENCOUNTER — Encounter (HOSPITAL_COMMUNITY): Payer: Self-pay | Admitting: General Surgery

## 2016-09-25 LAB — PHOSPHORUS: PHOSPHORUS: 3.3 mg/dL (ref 2.5–4.6)

## 2016-09-25 LAB — BASIC METABOLIC PANEL
Anion gap: 6 (ref 5–15)
BUN: 9 mg/dL (ref 6–20)
CALCIUM: 8.2 mg/dL — AB (ref 8.9–10.3)
CHLORIDE: 102 mmol/L (ref 101–111)
CO2: 30 mmol/L (ref 22–32)
CREATININE: 0.93 mg/dL (ref 0.61–1.24)
GFR calc Af Amer: >60 mL/min (ref >60)
GFR calc non Af Amer: >60 mL/min (ref >60)
Glucose, Bld: 93 mg/dL (ref 65–99)
Potassium: 3.6 mmol/L (ref 3.5–5.1)
SODIUM: 138 mmol/L (ref 135–145)

## 2016-09-25 LAB — CBC
HCT: 39 % (ref 39.0–52.0)
Hemoglobin: 13.6 g/dL (ref 13.0–17.0)
MCH: 31.3 pg (ref 26.0–34.0)
MCHC: 34.9 g/dL (ref 30.0–36.0)
MCV: 89.9 fL (ref 78.0–100.0)
PLATELETS: 222 10*3/uL (ref 150–400)
RBC: 4.34 MIL/uL (ref 4.22–5.81)
RDW: 12.4 % (ref 11.5–15.5)
WBC: 6.8 10*3/uL (ref 4.0–10.5)

## 2016-09-25 LAB — MAGNESIUM: MAGNESIUM: 1.9 mg/dL (ref 1.7–2.4)

## 2016-09-25 MED ORDER — OXYCODONE-ACETAMINOPHEN 7.5-325 MG PO TABS: 1.0000 | ORAL_TABLET | ORAL | 0 refills | Status: DC | PRN

## 2016-09-25 NOTE — Discharge Planning (Addendum)
Patient IV removed.  Discharge papers given, explained and educated.  Taught how to take care of surgical would at home and what s/sx to look for indicating a call to surgeon d/t possible infection.  Informed that staples would probably be removed at FU appt. Appt made and patient informed.  Pain script given.  When ready, will be wheeled to front and family transporting home via car.

## 2016-09-25 NOTE — Discharge Instructions (Signed)
Open Colectomy, Care After °This sheet gives you information about how to care for yourself after your procedure. Your health care provider may also give you more specific instructions. If you have problems or questions, contact your health care provider. °What can I expect after the procedure? °After the procedure, it is common to have: °· Pain in your abdomen, especially along your incision. °· Tiredness. Your energy level will return to normal over the next several weeks. °· Constipation. °· Nausea. °· Difficulty urinating. °Follow these instructions at home: °Activity  °· You may be able to return to most of your normal activities within 1-2 weeks, such as working, walking up stairs, and sexual activity. °· Avoid activities that require a lot of energy for 4-6 weeks after surgery, such as running, climbing, and lifting heavy objects. Ask your health care provider what activities are safe for you. °· Take rest breaks during the day as needed. °· Do not drive for 1-2 weeks or until your health care provider says that it is safe. °· Do not drive or use heavy machinery while taking prescription pain medicines. °· Do not lift anything that is heavier than 10 lb (4.3 kg) until your health care provider says that it is safe. °Incision care  °· Follow instructions from your health care provider about how to take care of your incision. Make sure you: °¨ Wash your hands with soap and water before you change your bandage (dressing). If soap and water are not available, use hand sanitizer. °¨ Change your dressing as told by your health care provider. °¨ Leave stitches (sutures) or staples in place. These skin closures may need to stay in place for 2 weeks or longer. °· Avoid wearing tight clothing around your incision. °· Protect your incision area from the sun. °· Check your incision area every day for signs of infection. Check for: °¨ More redness, swelling, or pain. °¨ More fluid or blood. °¨ Warmth. °¨ Pus or a bad  smell. °General instructions  °· Do not take baths, swim, or use a hot tub until your health care provider approves. Ask your health care provider when you may shower. °· Take over-the-counter and prescription medicines, including stool softeners, only as told by your health care provider. °· Eat a low-fat and low-fiber diet for the first 4 weeks after surgery. °· Keep all follow-up visits as told by your health care provider. This is important. °Contact a health care provider if: °· You have more redness, swelling, or pain around your incision. °· You have more fluid or blood coming from your incision. °· Your incision feels warm to the touch. °· You have pus or a bad smell coming from your incision. °· You have a fever or chills. °· You do not have a bowel movement 2-3 days after surgery. °· You cannot eat or drink for 24 hours or more. °· You have persistent nausea and vomiting. °· You have abdominal pain that gets worse and does not get better with medicine. °Get help right away if: °· You have chest pain. °· You have shortness of breath. °· You have pain or swelling in your legs. °· Your incision breaks open after your sutures or staples have been removed. °· You have bleeding from the rectum. °This information is not intended to replace advice given to you by your health care provider. Make sure you discuss any questions you have with your health care provider. °Document Released: 01/20/2011 Document Revised: 03/30/2016 Document Reviewed: 03/30/2016 °Elsevier   Interactive Patient Education  2017 [employer].

## 2016-09-25 NOTE — Discharge Summary (Signed)
Physician Discharge Summary  Patient ID: Bladen Umar MRN: [id number] DOB/AGE: [birth year] 51 y.o.  Admit date: 09/23/2016 Discharge date: 09/25/2016  Admission Diagnoses:Colon carcinoma  Discharge Diagnoses: Same Active Problems:   Malignant neoplasm of sigmoid colon [facility])   Colon cancer [facility])   Discharged Condition: good  Hospital Course: Patient is a 51 year old white male who was found on screening colonoscopy to have a sigmoid colon carcinoma. He underwent a partial colectomy on 09/23/2016. Tolerated the procedure well. His postoperative course was unremarkable. His diet was advanced without difficulty. The patient is being discharged home on postoperative day 2 in good and improving condition. Final pathology is still pending.  Treatments: surgery: Partial colectomy on 09/23/2016  Discharge Exam: Blood pressure 132/77, pulse 77, temperature 98 F (36.7 C), temperature source Oral, resp. rate 20, height 5\' 8"  (1.727 m), weight 186 lb 1.1 oz (84.4 kg), SpO2 98 %. General appearance: alert, cooperative and no distress Resp: clear to auscultation bilaterally Cardio: regular rate and rhythm, S1, S2 normal, no murmur, click, rub or gallop GI: Soft, incision healing well.  Disposition: 01-Home or Self Care  Discharge Instructions    Diet general    Complete by:  As directed    Increase activity slowly    Complete by:  As directed      Allergies as of 09/25/2016   No Known Allergies     Medication List    TAKE these medications   acetaminophen 500 MG tablet Commonly known as:  TYLENOL Take 500-1,000 mg by mouth every 8 (eight) hours as needed for headache.   metroNIDAZOLE 500 MG tablet Commonly known as:  FLAGYL Take 1 tablet (500 mg total) by mouth 3 (three) times daily. Take at 1pm, 2pm, and 9pm the day before surgery   neomycin 500 MG tablet Commonly known as:  MYCIFRADIN Take 2 tablets (1,000 mg total) by mouth 3 (three) times daily. Take at 1pm, 2pm, and 9pm the  day before surgery   oxyCODONE-acetaminophen 7.5-325 MG tablet Commonly known as:  PERCOCET Take 1-2 tablets by mouth every 4 (four) hours as needed.   simvastatin 20 MG tablet Commonly known as:  ZOCOR Take 1 tablet (20 mg total) by mouth daily at 6 PM. What changed:  when to take this      Follow-up Information    Aviva Signs, MD On 10/01/2016.   Specialty:  General Surgery Why:  at 10:45 am Contact information: [id number] [place] [address] [phone]           Signed: Aviva Signs 09/25/2016, 10:14 AM

## 2016-09-29 ENCOUNTER — Encounter: Payer: Self-pay | Admitting: General Surgery

## 2016-09-29 ENCOUNTER — Telehealth: Payer: Self-pay | Admitting: General Surgery

## 2016-09-29 LAB — TYPE AND SCREEN
ABO/RH(D): B POS
Antibody Screen: NEGATIVE

## 2016-09-29 NOTE — Telephone Encounter (Signed)
Patient notified of test results 

## 2016-10-01 ENCOUNTER — Ambulatory Visit (INDEPENDENT_AMBULATORY_CARE_PROVIDER_SITE_OTHER): Payer: Self-pay | Admitting: General Surgery

## 2016-10-01 ENCOUNTER — Encounter: Payer: Self-pay | Admitting: General Surgery

## 2016-10-01 VITALS — BP 142/92 | HR 76 | Temp 97.8°F | Resp 18 | Ht 67.0 in | Wt 198.0 lb

## 2016-10-01 DIAGNOSIS — Z09 Encounter for follow-up examination after completed treatment for conditions other than malignant neoplasm: Secondary | ICD-10-CM

## 2016-10-01 NOTE — Progress Notes (Signed)
Subjective:     Scott Meyers  Status post sigmoid colectomy. Doing very well. No complaints noted. Appetite has improved. Is having normal bowel movements. No fever or chills noted. Objective:    BP (!) 142/92   Pulse 76   Temp 97.8 F (36.6 C)   Resp 18   Ht 5\' 7"  (1.702 m)   Wt 198 lb (89.8 kg)   BMI 31.01 kg/m   General:  alert, cooperative and no distress  Abdomen soft. Incision healing well. Staples removed, Steri-Strips applied.     Assessment:    Doing well postoperatively.    Plan:  Patient told her that final pathology revealed no residual cancer. This was discussed with Dr. Laural Golden, who will follow the patient concerning his colon cancer. No need for referral to oncology at this time. I will see the patient back again in 3 weeks for follow-up. He should avoid any heavy lifting.

## 2016-10-22 ENCOUNTER — Ambulatory Visit (INDEPENDENT_AMBULATORY_CARE_PROVIDER_SITE_OTHER): Payer: Self-pay | Admitting: General Surgery

## 2016-10-22 ENCOUNTER — Encounter: Payer: Self-pay | Admitting: General Surgery

## 2016-10-22 VITALS — BP 154/91 | HR 80 | Temp 98.9°F | Resp 18 | Ht 67.0 in | Wt 198.0 lb

## 2016-10-22 DIAGNOSIS — Z09 Encounter for follow-up examination after completed treatment for conditions other than malignant neoplasm: Secondary | ICD-10-CM

## 2016-10-22 NOTE — Progress Notes (Signed)
Subjective:     Scott Meyers status post partial colectomy. Patient still having mild incisional pain with movement. His bowel movements are normal. He denies any nausea or vomiting.  Objective:    BP (!) 154/91   Pulse 80   Temp 98.9 F (37.2 C)   Resp 18   Ht 5\' 7"  (1.702 m)   Wt 198 lb (89.8 kg)   BMI 31.01 kg/m   General:  alert, cooperative and no distress  Abdomen is soft. Incision healing well. Bowel sounds active. No hernias appreciated.     Assessment:    Doing well postoperatively.    Plan:We will reassess again in 2 weeks for return to work privileges.  no heavy lifting until he is reassessed in 2 weeks.

## 2016-11-05 ENCOUNTER — Ambulatory Visit (INDEPENDENT_AMBULATORY_CARE_PROVIDER_SITE_OTHER): Payer: Self-pay | Admitting: General Surgery

## 2016-11-05 ENCOUNTER — Encounter: Payer: Self-pay | Admitting: General Surgery

## 2016-11-05 VITALS — BP 147/93 | HR 74 | Temp 98.4°F | Resp 18 | Ht 67.0 in | Wt 202.0 lb

## 2016-11-05 DIAGNOSIS — Z09 Encounter for follow-up examination after completed treatment for conditions other than malignant neoplasm: Secondary | ICD-10-CM

## 2016-11-05 NOTE — Progress Notes (Signed)
Subjective:     Scott Meyers  Status post partial colectomy. He shouldn't still having some incisional pain as he has increased his activity. No fever or chills. Objective:    BP (!) 147/93   Pulse 74   Temp 98.4 F (36.9 C)   Resp 18   Ht 5\' 7"  (1.702 m)   Wt 202 lb (91.6 kg)   BMI 31.64 kg/m   General:  alert, cooperative and no distress  Abdomen soft. Incision healing well with variable tenderness to palpation. No induration noted.     Assessment:    Doing well postoperatively. Does have some incisional pain with increased activity which is not unexpected.    Plan:   Patient was instructed to increase activity as able. No significant heavy lifting at this time. We'll reevaluate in 2 weeks. He needs to stay out of work during that time.

## 2016-11-09 ENCOUNTER — Telehealth: Payer: Self-pay | Admitting: Family Medicine

## 2016-11-09 DIAGNOSIS — R7301 Impaired fasting glucose: Secondary | ICD-10-CM

## 2016-11-09 DIAGNOSIS — E785 Hyperlipidemia, unspecified: Secondary | ICD-10-CM

## 2016-11-09 DIAGNOSIS — Z79899 Other long term (current) drug therapy: Secondary | ICD-10-CM

## 2016-11-09 NOTE — Telephone Encounter (Signed)
Patient has an appointment on 11/27/16 with Dr. Richardson Landry.  He is requesting order for blood work.

## 2016-11-09 NOTE — Telephone Encounter (Signed)
Orders for bloodwork put in. Pt notified on voicemail.  

## 2016-11-09 NOTE — Telephone Encounter (Signed)
Lip liv glu 

## 2016-11-18 LAB — LIPID PANEL
CHOL/HDL RATIO: 3.9 ratio (ref 0.0–5.0)
CHOLESTEROL TOTAL: 200 mg/dL — AB (ref 100–199)
HDL: 51 mg/dL (ref >39)
LDL Calculated: 117 mg/dL — ABNORMAL HIGH (ref 0–99)
TRIGLYCERIDES: 160 mg/dL — AB (ref 0–149)
VLDL Cholesterol Cal: 32 mg/dL (ref 5–40)

## 2016-11-18 LAB — HEPATIC FUNCTION PANEL
ALT: 47 IU/L — AB (ref 0–44)
AST: 33 IU/L (ref 0–40)
Albumin: 4.5 g/dL (ref 3.5–5.5)
Alkaline Phosphatase: 109 IU/L (ref 39–117)
BILIRUBIN TOTAL: 0.9 mg/dL (ref 0.0–1.2)
BILIRUBIN, DIRECT: 0.25 mg/dL (ref 0.00–0.40)
Total Protein: 7.1 g/dL (ref 6.0–8.5)

## 2016-11-18 LAB — GLUCOSE, RANDOM: Glucose: 117 mg/dL — ABNORMAL HIGH (ref 65–99)

## 2016-11-19 ENCOUNTER — Ambulatory Visit (INDEPENDENT_AMBULATORY_CARE_PROVIDER_SITE_OTHER): Payer: Self-pay | Admitting: General Surgery

## 2016-11-19 ENCOUNTER — Encounter: Payer: Self-pay | Admitting: General Surgery

## 2016-11-19 VITALS — BP 164/94 | HR 83 | Temp 98.7°F | Resp 18 | Ht 67.0 in | Wt 196.0 lb

## 2016-11-19 DIAGNOSIS — Z09 Encounter for follow-up examination after completed treatment for conditions other than malignant neoplasm: Secondary | ICD-10-CM

## 2016-11-19 NOTE — Progress Notes (Signed)
Subjective:     Scott Meyers patient doing well. Incisional pain has decreased with time. No fever or chills. Appetite normal. Bowel movements normal.  Objective:    BP (!) 164/94   Pulse 83   Temp 98.7 F (37.1 C)   Resp 18   Ht 5\' 7"  (1.702 m)   Wt 196 lb (88.9 kg)   BMI 30.70 kg/m   General:  alert and cooperative  Abdomen soft, incision well healed.     Assessment:    Doing well postoperatively.    Plan:Patient may return to work on 12/10/2016 without restrictions. Follow-up. when necessary.

## 2016-11-27 ENCOUNTER — Encounter: Payer: Self-pay | Admitting: Family Medicine

## 2016-11-27 ENCOUNTER — Ambulatory Visit (INDEPENDENT_AMBULATORY_CARE_PROVIDER_SITE_OTHER): Payer: BC Managed Care – PPO | Admitting: Family Medicine

## 2016-11-27 VITALS — BP 138/94 | Ht 67.0 in | Wt 198.0 lb

## 2016-11-27 DIAGNOSIS — I10 Essential (primary) hypertension: Secondary | ICD-10-CM

## 2016-11-27 DIAGNOSIS — E785 Hyperlipidemia, unspecified: Secondary | ICD-10-CM | POA: Diagnosis not present

## 2016-11-27 DIAGNOSIS — R7303 Prediabetes: Secondary | ICD-10-CM

## 2016-11-27 MED ORDER — VALSARTAN 160 MG PO TABS: 160.0000 mg | ORAL_TABLET | Freq: Every day | ORAL | 5 refills | Status: DC

## 2016-11-27 MED ORDER — SIMVASTATIN 20 MG PO TABS: 20.0000 mg | ORAL_TABLET | Freq: Every day | ORAL | 5 refills | Status: DC

## 2016-11-27 NOTE — Progress Notes (Addendum)
   Subjective:    Patient ID: Scott Meyers, male    DOB: 1965/08/13, 51 y.o.   MRN: [id number] Patient arrives for follow-up of multiple concerns Hyperlipidemia  This is a chronic problem. Treatments tried: simvastatin. Compliance problems include adherence to diet.    Pain in hands. Started over one year ago. Feels tight and achey, worse after sleping and sitting, no history of injury. No history of rheumatoid arthritis  Results for orders placed or performed in visit on 11/09/16  Lipid panel  Result Value Ref Range   Cholesterol, Total 200 (H) 100 - 199 mg/dL   Triglycerides 160 (H) 0 - 149 mg/dL   HDL 51 >39 mg/dL   VLDL Cholesterol Cal 32 5 - 40 mg/dL   LDL Calculated 117 (H) 0 - 99 mg/dL   Chol/HDL Ratio 3.9 0.0 - 5.0 ratio  Hepatic function panel  Result Value Ref Range   Total Protein 7.1 6.0 - 8.5 g/dL   Albumin 4.5 3.5 - 5.5 g/dL   Bilirubin Total 0.9 0.0 - 1.2 mg/dL   Bilirubin, Direct 0.25 0.00 - 0.40 mg/dL   Alkaline Phosphatase 109 39 - 117 IU/L   AST 33 0 - 40 IU/L   ALT 47 (H) 0 - 44 IU/L  Glucose  Result Value Ref Range   Glucose 117 (H) 65 - 99 mg/dL     Patient continues to take lipid medication regularly. No obvious side effects from it. Generally does not miss a dose. Prior blood work results are reviewed with patient. Patient continues to work on fat intake in diet  Blood pressure medicine and blood pressure levels reviewed today with patient. Compliant with blood pressure medicine. States does not miss a dose. No obvious side effects. Blood pressure generally good when checked elsewhere. Watching salt intake.  No fam hx of high blood presure  Patient has known history of prediabetes. Trying to watch diet. Positive family history diabetes.  Review of Systems No headache, no major weight loss or weight gain, no chest pain no back pain abdominal pain no change in bowel habits complete ROS otherwise negative     Objective:   Physical Exam Alert vitals  stable, NAD. Blood pressure good on repeat. HEENT normal. Lungs clear. Heart regular rate and rhythm.        Assessment & Plan:  Impression essential hypertension blood pressure the last year elevated nearly every visits to specialists office hospital our office etc. long discussion held patient wanting to get on with medication. #2 hyperlipidemia good control discussed maintain same meds #3 hand pain nonspecific doubt true arthritis #4 recent colon cancer diagnosis discussed #5 prediabetes discussed diet exercise discussed. Meds refilled. Follow-up in 6 months for chronic plus wellness visit

## 2016-11-27 NOTE — Patient Instructions (Signed)
Prediabetes Prediabetes is the condition of having a blood sugar (blood glucose) level that is higher than it should be, but not high enough for you to be diagnosed with type 2 diabetes. Having prediabetes puts you at risk for developing type 2 diabetes (type 2 diabetes mellitus). Prediabetes may be called impaired glucose tolerance or impaired fasting glucose. Prediabetes usually does not cause symptoms. Your health care provider can diagnose this condition with blood tests. You may be tested for prediabetes if you are overweight and if you have at least one other risk factor for prediabetes. Risk factors for prediabetes include:  Having a family member with type 2 diabetes.  Being overweight or obese.  Being older than age 51.  Being of American-Indian, African-American, Hispanic/Latino, or Asian/Pacific Islander descent.  Having an inactive (sedentary) lifestyle.  Having a history of gestational diabetes or polycystic ovarian syndrome (PCOS).  Having low levels of good cholesterol (HDL-C) or high levels of blood fats (triglycerides).  Having high blood pressure. What is blood glucose and how is blood glucose measured?   Blood glucose refers to the amount of glucose in your bloodstream. Glucose comes from eating foods that contain sugars and starches (carbohydrates) that the body breaks down into glucose. Your blood glucose level may be measured in mg/dL (milligrams per deciliter) or mmol/L (millimoles per liter).Your blood glucose may be checked with one or more of the following blood tests:  A fasting blood glucose (FBG) test. You will not be allowed to eat (you will fast) for at least 8 hours before a blood sample is taken.  A normal range for FBG is 70-100 mg/dl (3.9-5.6 mmol/L).  An A1c (hemoglobin A1c) blood test. This test provides information about blood glucose control over the previous 2?69months.  An oral glucose tolerance test (OGTT). This test measures your blood glucose  twice:  After fasting. This is your baseline level.  Two hours after you drink a beverage that contains glucose. You may be diagnosed with prediabetes:  If your FBG is 100?125 mg/dL (5.6-6.9 mmol/L).  If your A1c level is 5.7?6.4%.  If your OGGT result is 140?199 mg/dL (7.8-11 mmol/L). These blood tests may be repeated to confirm your diagnosis. What happens if blood glucose is too high? The pancreas produces a hormone (insulin) that helps move glucose from the bloodstream into cells. When cells in the body do not respond properly to insulin that the body makes (insulin resistance), excess glucose builds up in the blood instead of going into cells. As a result, high blood glucose (hyperglycemia) can develop, which can cause many complications. This is a symptom of prediabetes. What can happen if blood glucose stays higher than normal for a long time? Having high blood glucose for a long time is dangerous. Too much glucose in your blood can damage your nerves and blood vessels. Long-term damage can lead to complications from diabetes, which may include:  Heart disease.  Stroke.  Blindness.  Kidney disease.  Depression.  Poor circulation in the feet and legs, which could lead to surgical removal (amputation) in severe cases. How can prediabetes be prevented from turning into type 2 diabetes?   To help prevent type 2 diabetes, take the following actions:  Be physically active.  Do moderate-intensity physical activity for at least 30 minutes on at least 5 days of the week, or as much as told by your health care provider. This could be brisk walking, biking, or water aerobics.  Ask your health care provider what activities are  safe for you. A mix of physical activities may be best, such as walking, swimming, cycling, and strength training.  Lose weight as told by your health care provider.  Losing 5-7% of your body weight can reverse insulin resistance.  Your health care  provider can determine how much weight loss is best for you and can help you lose weight safely.  Follow a healthy meal plan. This includes eating lean proteins, complex carbohydrates, fresh fruits and vegetables, low-fat dairy products, and healthy fats.  Follow instructions from your health care provider about eating or drinking restrictions.  Make an appointment to see a diet and nutrition specialist (registered dietitian) to help you create a healthy eating plan that is right for you.  Do not smoke or use any tobacco products, such as cigarettes, chewing tobacco, and e-cigarettes. If you need help quitting, ask your health care provider.  Take over-the-counter and prescription medicines as told by your health care provider. You may be prescribed medicines that help lower the risk of type 2 diabetes. This information is not intended to replace advice given to you by your health care provider. Make sure you discuss any questions you have with your health care provider. Document Released: 10/21/2015 Document Revised: 12/05/2015 Document Reviewed: 08/20/2015 Elsevier Interactive Patient Education  2017 [employer].

## 2017-01-25 ENCOUNTER — Ambulatory Visit (INDEPENDENT_AMBULATORY_CARE_PROVIDER_SITE_OTHER): Payer: BC Managed Care – PPO | Admitting: Family Medicine

## 2017-01-25 ENCOUNTER — Encounter: Payer: Self-pay | Admitting: Family Medicine

## 2017-01-25 VITALS — BP 130/80 | Ht 67.0 in | Wt 199.4 lb

## 2017-01-25 DIAGNOSIS — S76319A Strain of muscle, fascia and tendon of the posterior muscle group at thigh level, unspecified thigh, initial encounter: Secondary | ICD-10-CM

## 2017-01-25 NOTE — Progress Notes (Signed)
   Subjective:    Patient ID: Scott Meyers, male    DOB: 1966-01-25, 51 y.o.   MRN: [id number]  Leg Pain   The incident occurred 2 days ago. The incident occurred at home. The injury mechanism is unknown. The pain is present in the left leg. The quality of the pain is described as aching. The symptoms are aggravated by movement. He has tried ice and immobilization for the symptoms.  Patient was pressing hard against an down treat. 2 days ago. Felt a sudden pop in the posterior thigh. Now is developed bruising behind the knee. Having pain with walking. Very active lifestyle and job at this time. Patient states no other concerns this visit.    Review of Systems No headache, no major weight loss or weight gain, no chest pain no back pain abdominal pain no change in bowel habits complete ROS otherwise negative     Objective:   Physical Exam Alert vitals stable, NAD. Blood pressure good on repeat. HEENT normal. Lungs clear. Heart regular rate and rhythm. Left knee exam left hip exam within normal limits posterior thigh pain. Positive ecchymosis extending behind the knee and into the upper calf       Assessment & Plan:  Impression acute serious hamstring injury discussed at length. This warrants further specialty intervention. Likely will need a scan. Could potentially need surgery if 2 out of 3 of the hamstring muscle tendon units are ruptured. Discussed with patient.  Greater than 50% of this 25 minute face to face visit was spent in counseling and discussion and coordination of care regarding the above diagnosis/diagnosies

## 2017-02-18 ENCOUNTER — Telehealth: Payer: Self-pay | Admitting: Family Medicine

## 2017-02-18 ENCOUNTER — Other Ambulatory Visit: Payer: Self-pay | Admitting: *Deleted

## 2017-02-18 MED ORDER — PREDNISONE 20 MG PO TABS: ORAL_TABLET | ORAL | 0 refills | Status: DC

## 2017-02-18 NOTE — Telephone Encounter (Signed)
Pt seen multiple times in 2015 for turf toe. Pt states joint in his toe is painful and swollen the same way it was in 2015.

## 2017-02-18 NOTE — Telephone Encounter (Signed)
Adult pred tapr

## 2017-02-18 NOTE — Telephone Encounter (Signed)
Med sent to pharm. Pt notified.  

## 2017-02-18 NOTE — Telephone Encounter (Signed)
Pt is wanting to know if something can be called in for gout.     [facility]

## 2017-03-04 ENCOUNTER — Telehealth: Payer: Self-pay | Admitting: Family Medicine

## 2017-03-04 MED ORDER — LOSARTAN POTASSIUM 100 MG PO TABS: 100.0000 mg | ORAL_TABLET | Freq: Every day | ORAL | 3 refills | Status: DC

## 2017-03-04 NOTE — Telephone Encounter (Signed)
Change to losartan 100 plus three ref

## 2017-03-04 NOTE — Telephone Encounter (Signed)
Received a fax from pharmacy to change Valsartan.

## 2017-03-04 NOTE — Telephone Encounter (Signed)
Medication sent into pharmacy  

## 2017-03-04 NOTE — Addendum Note (Signed)
Addended by: Ofilia Neas R on: 03/04/2017 11:52 AM   Modules accepted: Orders

## 2017-03-05 ENCOUNTER — Telehealth: Payer: Self-pay | Admitting: *Deleted

## 2017-03-05 NOTE — Telephone Encounter (Signed)
Dr.Steve sent in Losartan 100 for the patient on yesterday and Valsartan was canceled.

## 2017-03-05 NOTE — Telephone Encounter (Signed)
Fax from [facility]. Valsartan 160mg  one qhs has been recalled. Suggested alternatives losartan potassium 50mg  tab, candesartan cilexetil 16mg  tb, irbesartan 150mg  tab, olmesartan medoxomil 20mg  tab, or telmisartan 40mg  tab.

## 2017-03-05 NOTE — Telephone Encounter (Signed)
Losartan 50 mg 1 daily, #30, 4 refills follow-up in the fall-stop valsartan

## 2017-03-05 NOTE — Telephone Encounter (Signed)
Ok so noted 

## 2017-05-04 IMAGING — CT CT ABD-PELV W/ CM
2 of 5 series · 16 of 46 positions shown, 18 images · IV contrast (Isovue)
Comparison: None.

CLINICAL DATA: Newly diagnosed colon carcinoma by colonoscopy.
Staging.

EXAM:
CT ABDOMEN AND PELVIS WITH CONTRAST
TECHNIQUE: Multidetector CT imaging of the abdomen and pelvis was performed
using the standard protocol following bolus administration of
intravenous contrast.
CONTRAST:  100mL MY8F87-UMM IOPAMIDOL (MY8F87-UMM) INJECTION 61%

[Series 2: axial st · axial · 0.79mm/px · z∈[-485,-50]mm · 13 of 103 slices shown, 15 images]
[im 8/103  soft-tissue]
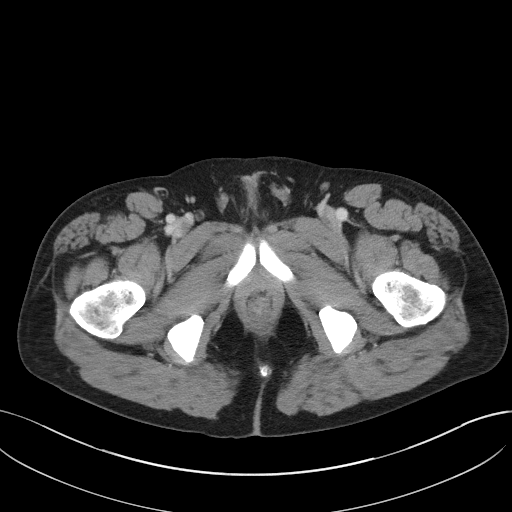
[im 8/103  bone]
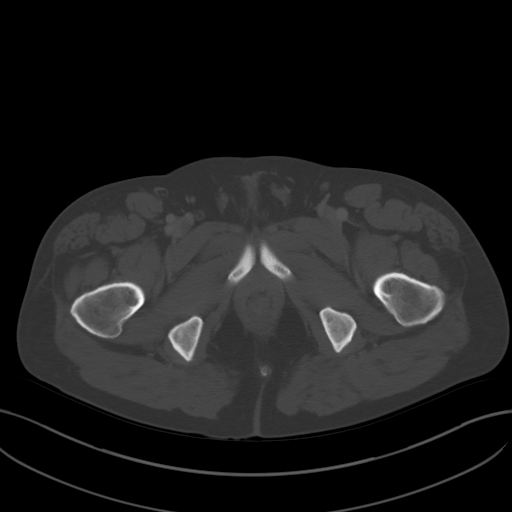
[im 15/103  soft-tissue]
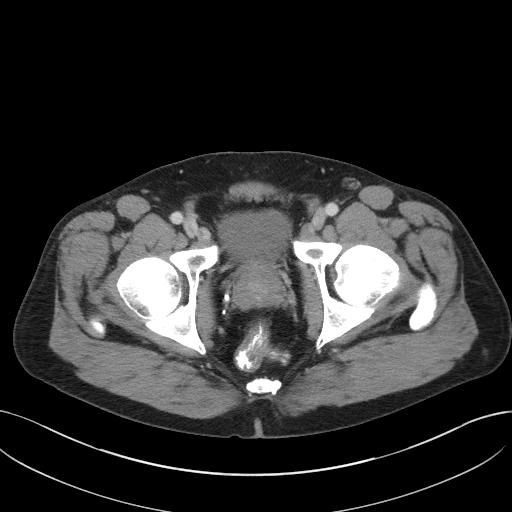
[im 22/103  soft-tissue]
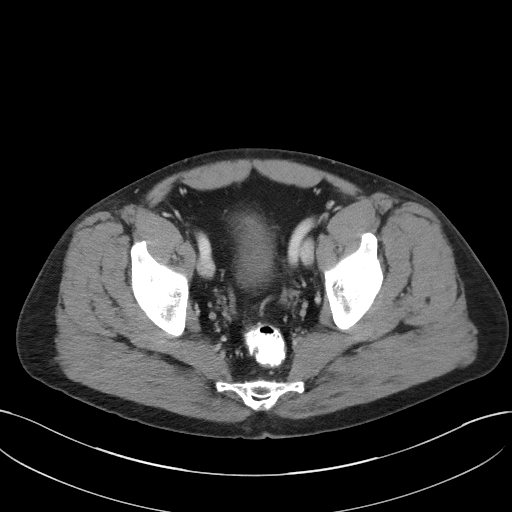
[im 30/103  soft-tissue]
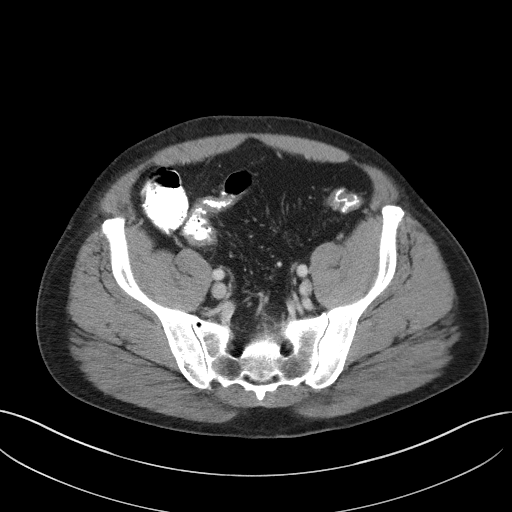
[im 37/103  soft-tissue]
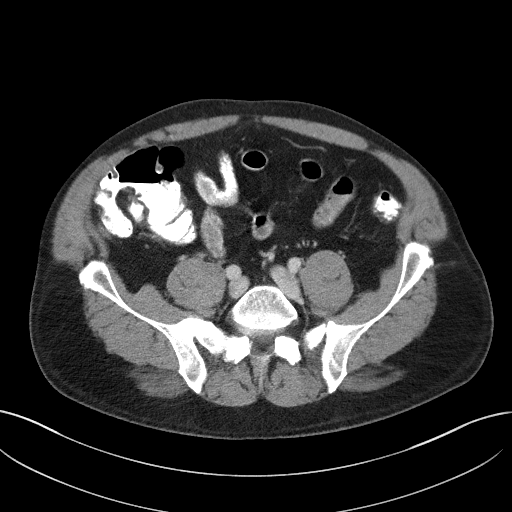
[im 44/103  soft-tissue]
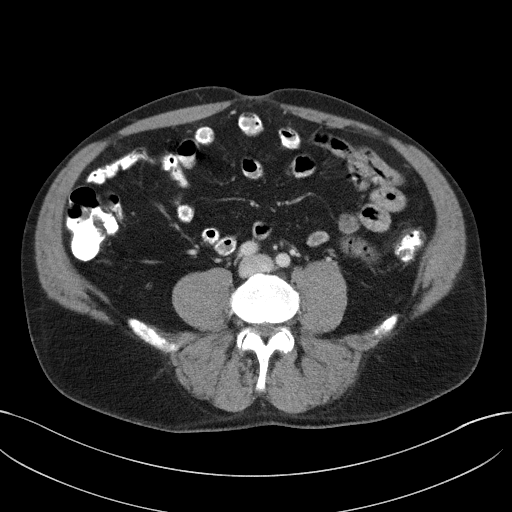
[im 52/103  soft-tissue]
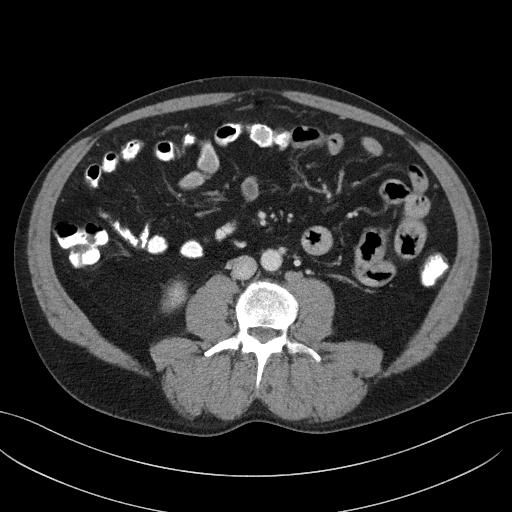
[im 59/103  soft-tissue]
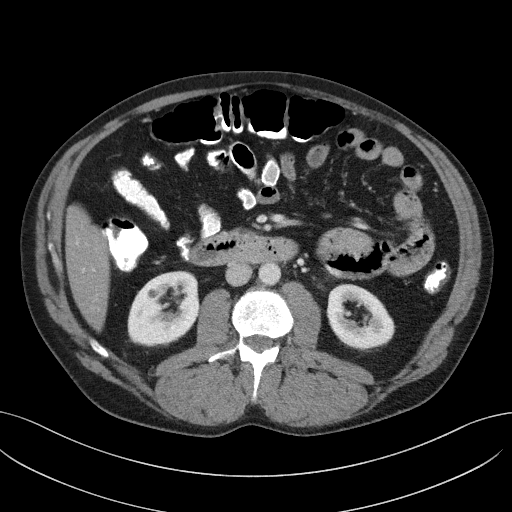
[im 66/103  soft-tissue]
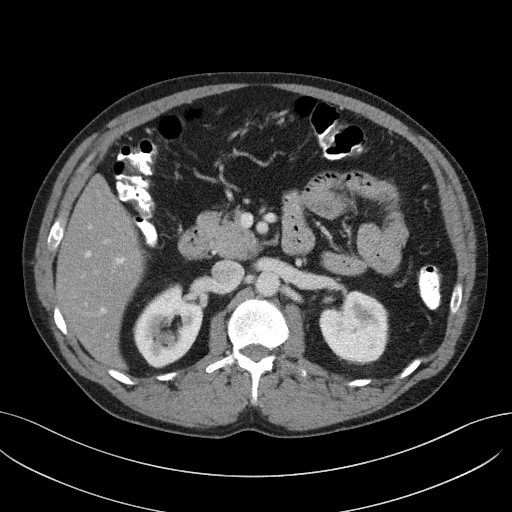
[im 66/103  bone]
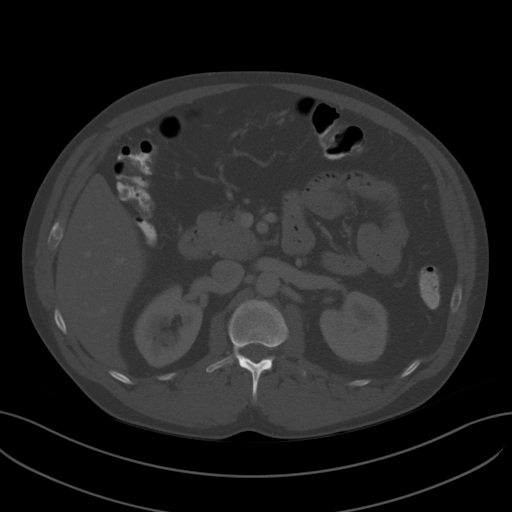
[im 73/103  soft-tissue]
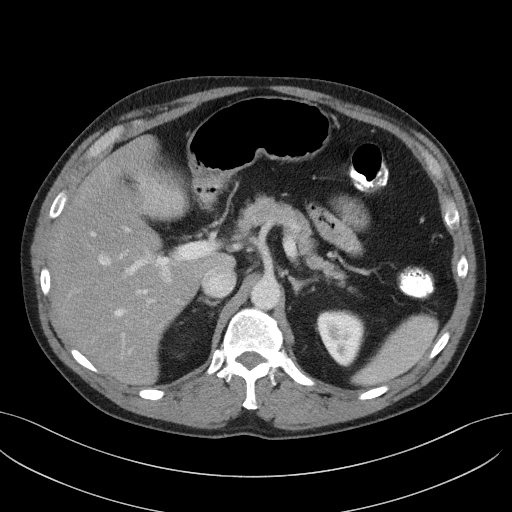
[im 81/103  soft-tissue]
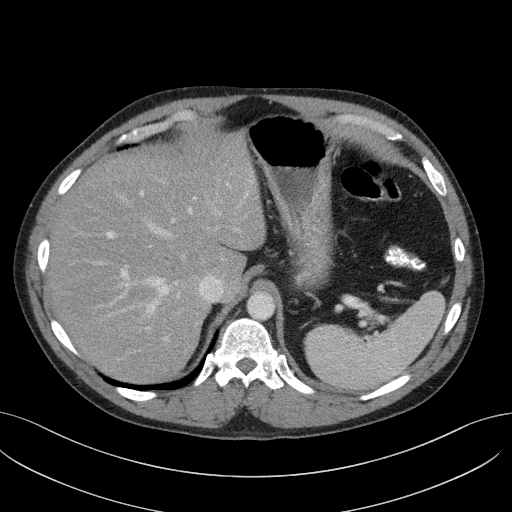
[im 88/103  soft-tissue]
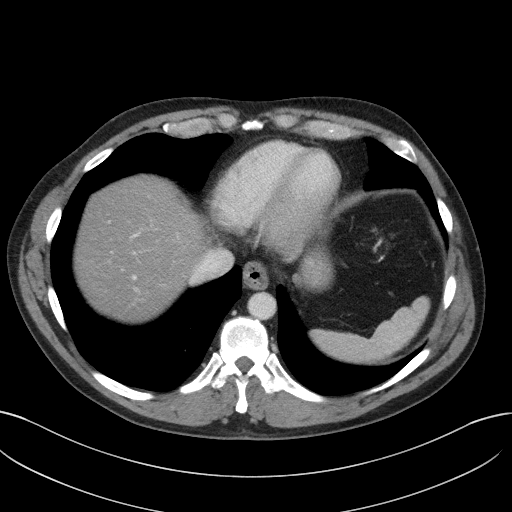
[im 95/103  soft-tissue]
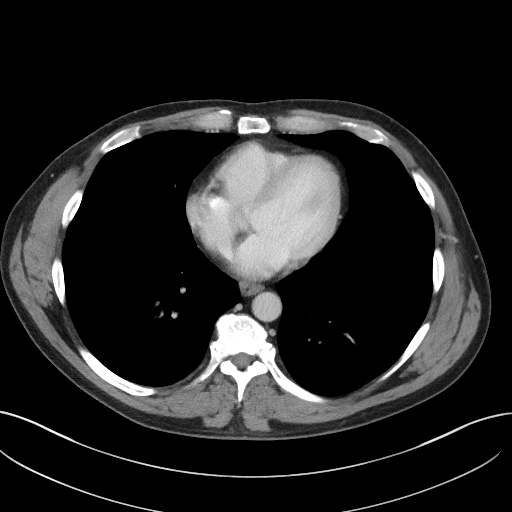

[Series 5: coronal st · coronal · 0.83mm/px · 3 of 109 slices shown]
[im 37/109  soft-tissue]
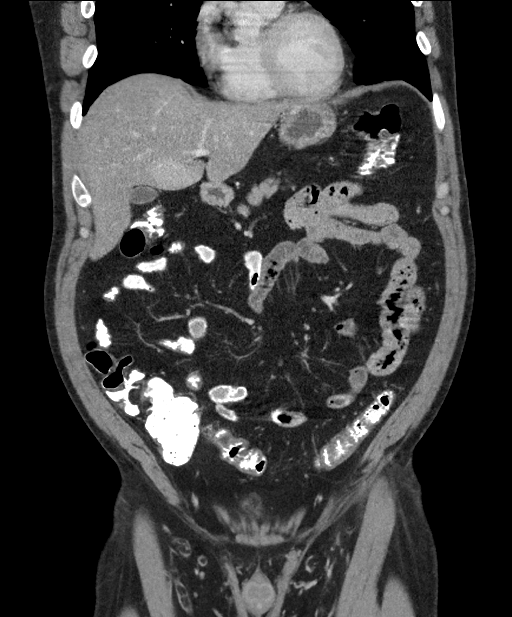
[im 49/109  soft-tissue]
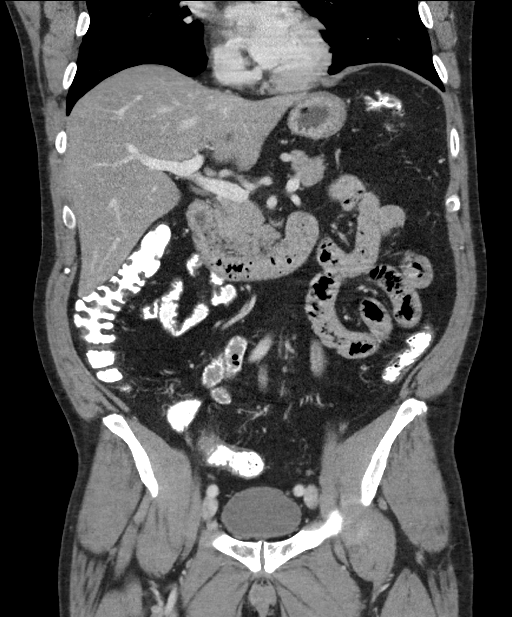
[im 61/109  soft-tissue]
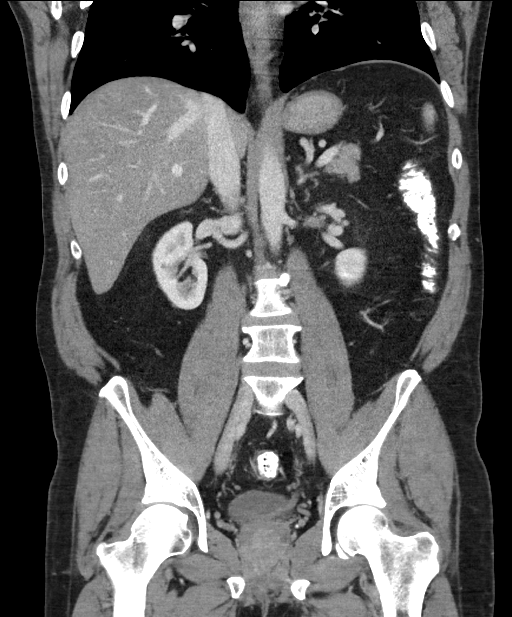

[16 of 46 positions shown; findings below may reference images not displayed]

FINDINGS: Lower Chest: No acute findings.

Hepatobiliary: No masses identified. Moderate diffuse hepatic
steatosis with focal fatty sparing seen in segment 4B. Gallbladder
is unremarkable.

Pancreas:  No mass or inflammatory changes.

Spleen: Within normal limits in size and appearance.

Adrenals/Urinary Tract: No masses identified. Tiny benign-appearing
right renal cyst. No evidence of hydronephrosis.

Stomach/Bowel: No evidence of obstruction, inflammatory process or
abnormal fluid collections. No masses identified.

Vascular/Lymphatic: No pathologically enlarged lymph nodes. No
abdominal aortic aneurysm.

Reproductive:  No mass identified.

Other:  None.

Musculoskeletal:  No suspicious bone lesions identified.
IMPRESSION: No evidence of metastatic disease within the abdomen or pelvis.

Moderate hepatic steatosis.

## 2017-05-07 IMAGING — DX DG CHEST 2V
2 series · 2 of 2 positions shown · non-contrast
Comparison: No recent.

CLINICAL DATA: Colon cancer.

EXAM:
CHEST  2 VIEW

[chest pa]
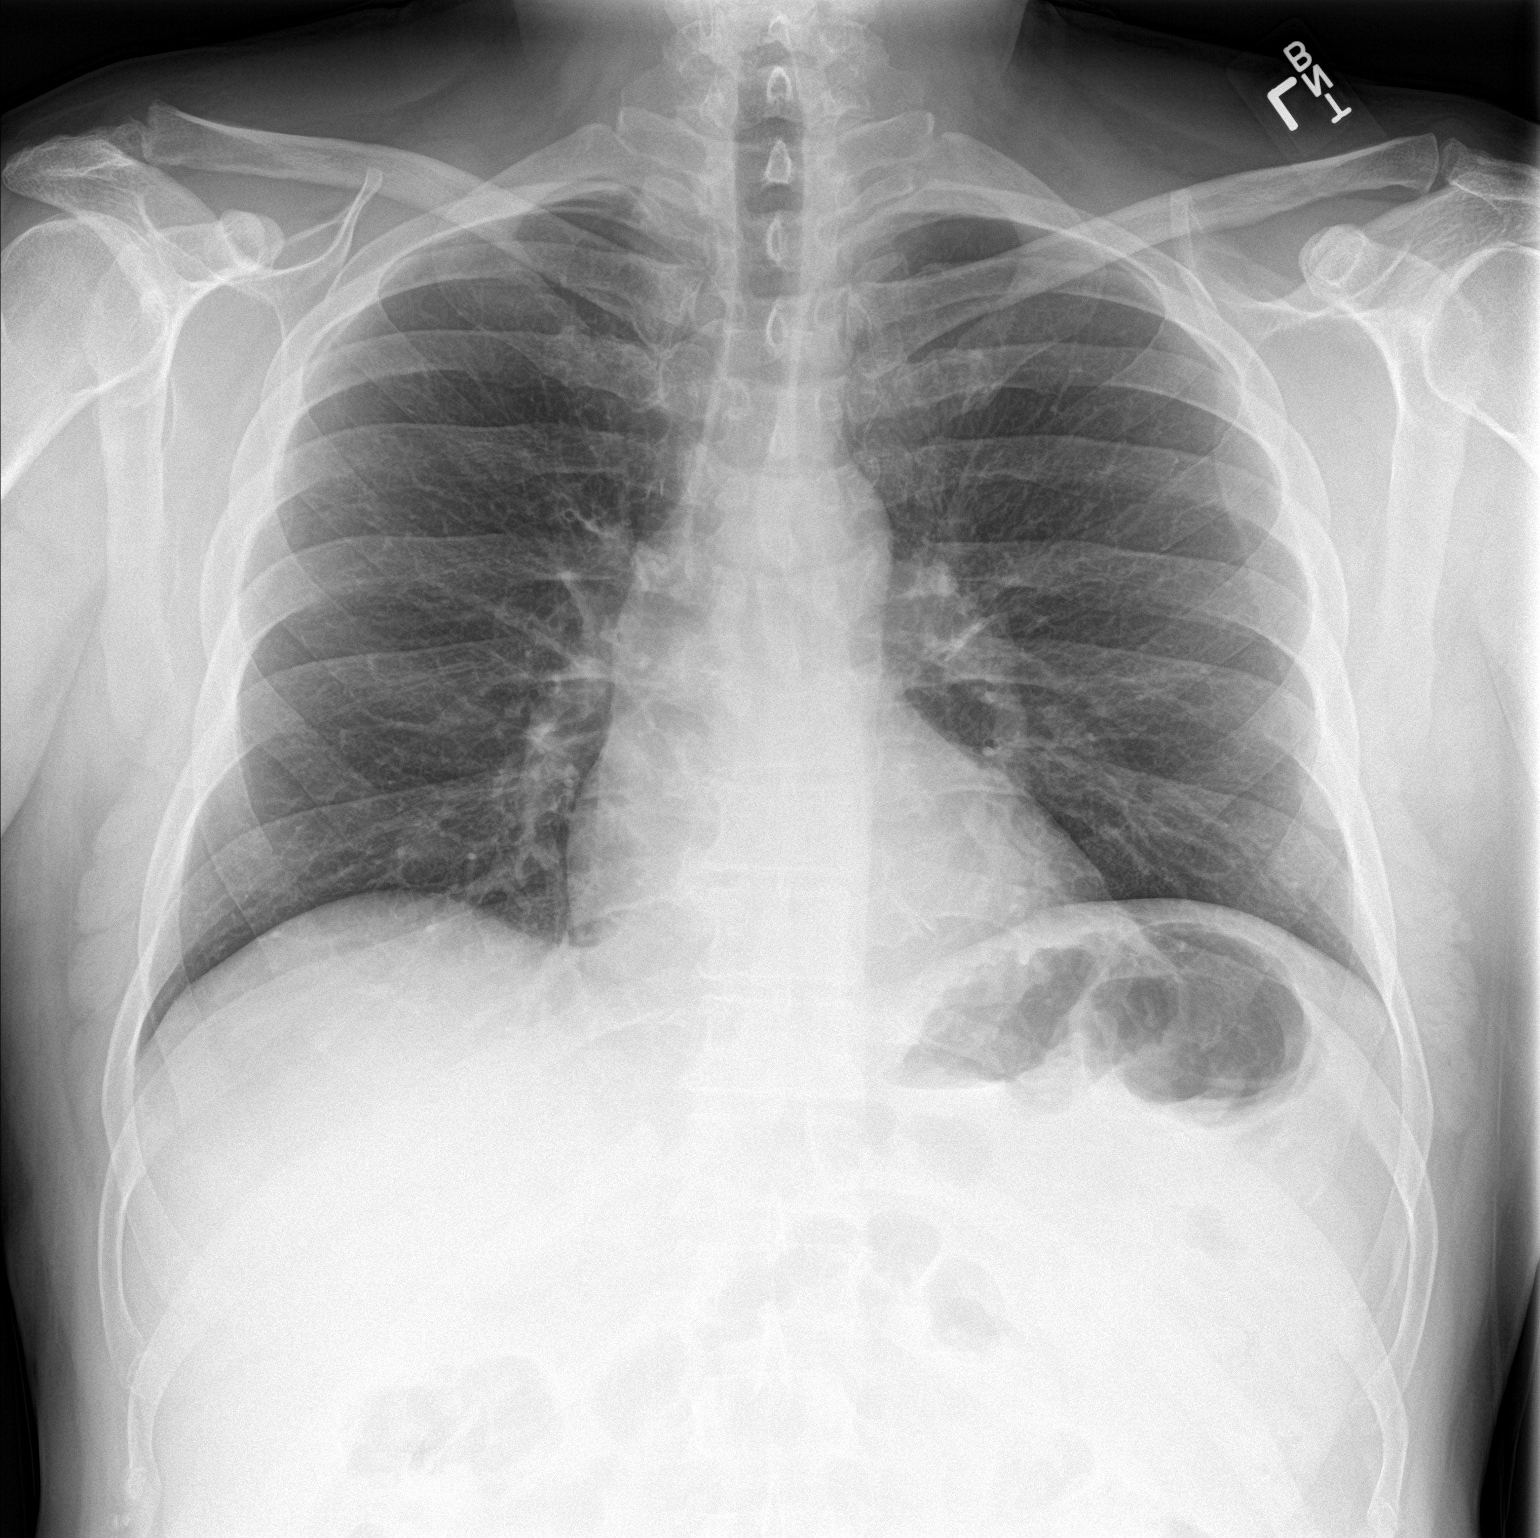

[chest lat]
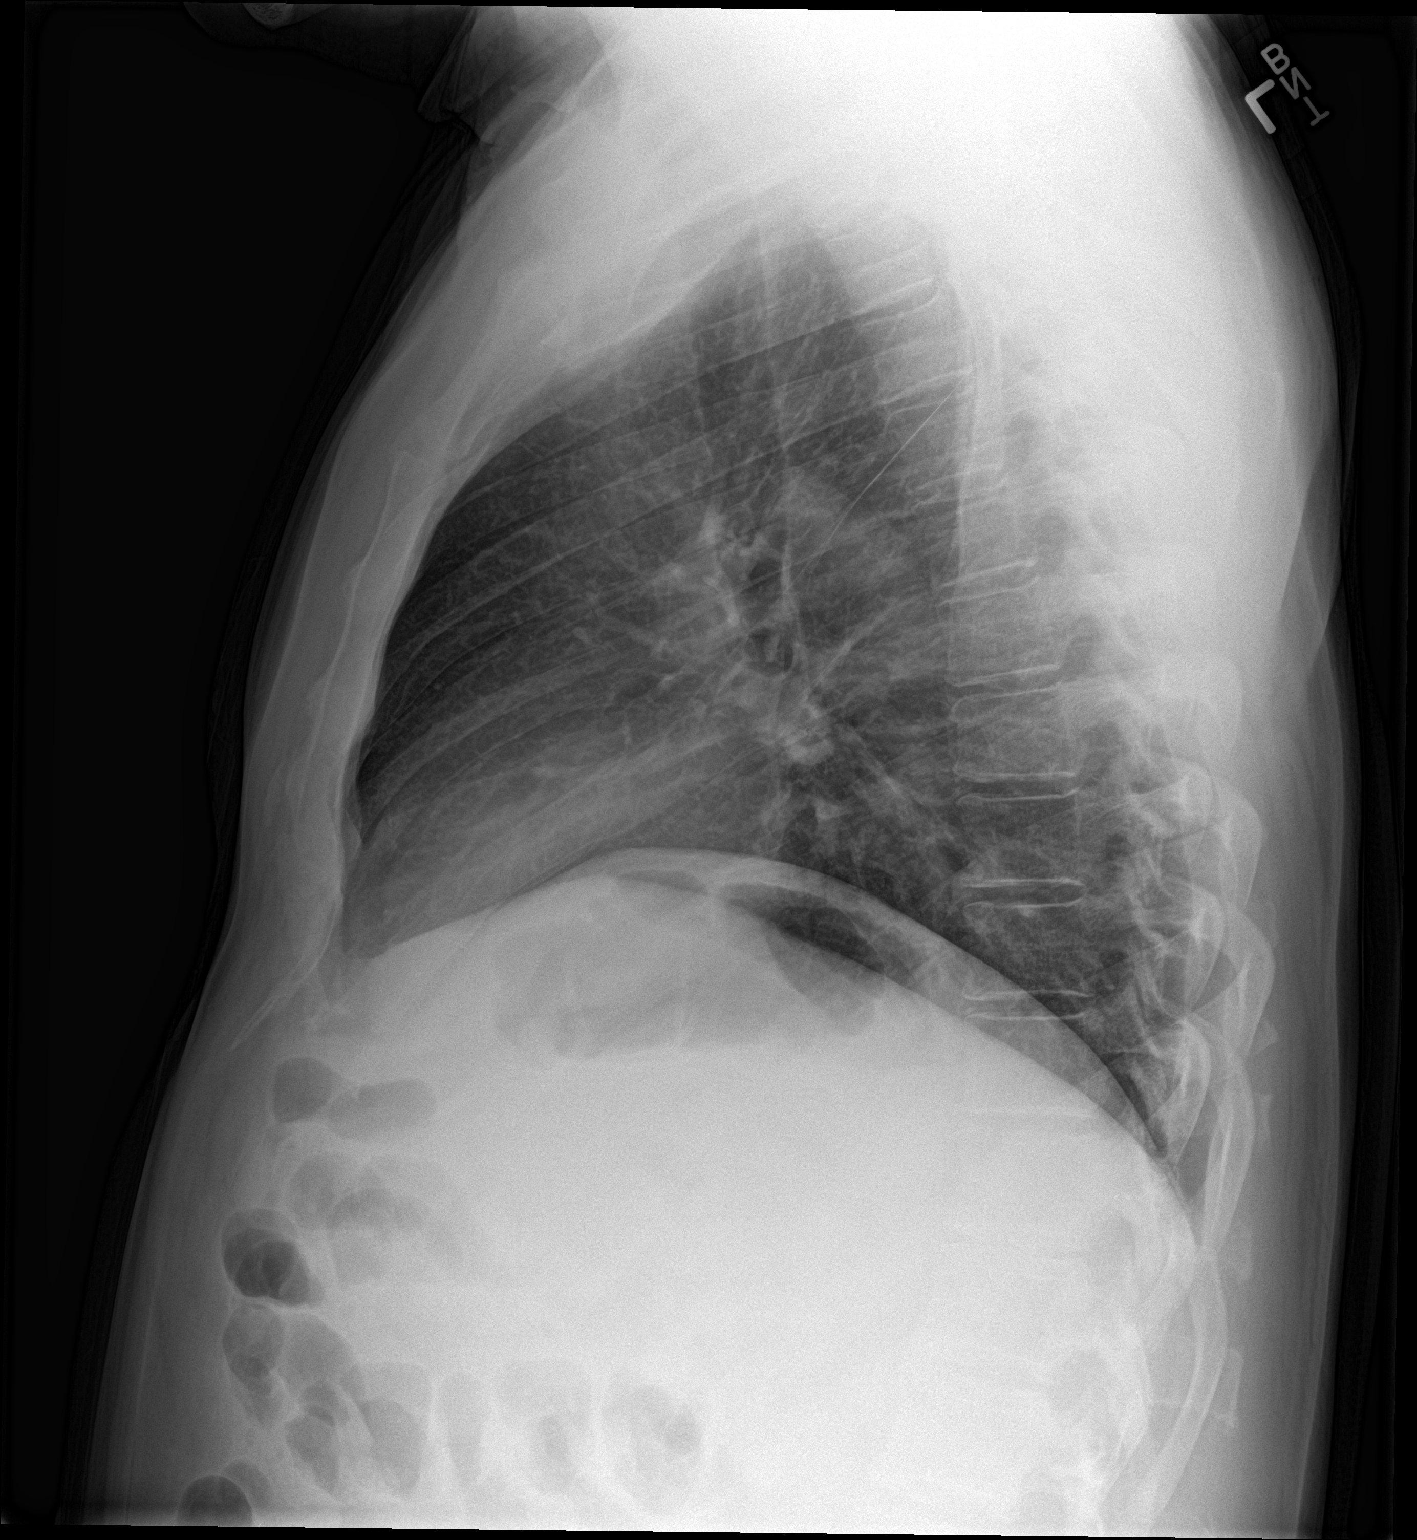

[2 of 2 positions shown; findings below may reference images not displayed]

FINDINGS: Mediastinum and hilar structures normal. Low lung volumes with mild
basilar atelectasis. No pleural effusion or pneumothorax.
IMPRESSION: Low lung volumes with mild basilar atelectasis.

## 2017-05-13 ENCOUNTER — Telehealth: Payer: Self-pay | Admitting: Family Medicine

## 2017-05-13 DIAGNOSIS — I10 Essential (primary) hypertension: Secondary | ICD-10-CM

## 2017-05-13 DIAGNOSIS — Z13220 Encounter for screening for lipoid disorders: Secondary | ICD-10-CM

## 2017-05-13 DIAGNOSIS — Z125 Encounter for screening for malignant neoplasm of prostate: Secondary | ICD-10-CM

## 2017-05-13 DIAGNOSIS — Z79899 Other long term (current) drug therapy: Secondary | ICD-10-CM

## 2017-05-13 NOTE — Telephone Encounter (Signed)
Last labs 11/17/16 lipid, liver, glucose

## 2017-05-13 NOTE — Telephone Encounter (Signed)
Patient has a physical coming up in 2 weeks and wants to have his labs done at [place].  Needs an order put in.

## 2017-05-16 NOTE — Telephone Encounter (Signed)
Lip liv m7 psa 

## 2017-05-17 NOTE — Telephone Encounter (Signed)
Orders sent, pt is aware to have drawn.

## 2017-05-20 LAB — LIPID PANEL
Chol/HDL Ratio: 3.9 ratio (ref 0.0–5.0)
Cholesterol, Total: 179 mg/dL (ref 100–199)
HDL: 46 mg/dL
LDL Calculated: 98 mg/dL (ref 0–99)
Triglycerides: 177 mg/dL — ABNORMAL HIGH (ref 0–149)
VLDL Cholesterol Cal: 35 mg/dL (ref 5–40)

## 2017-05-20 LAB — HEPATIC FUNCTION PANEL
ALBUMIN: 4.5 g/dL (ref 3.5–5.5)
ALK PHOS: 69 IU/L (ref 39–117)
ALT: 59 IU/L — ABNORMAL HIGH (ref 0–44)
AST: 38 IU/L (ref 0–40)
BILIRUBIN, DIRECT: 0.19 mg/dL (ref 0.00–0.40)
Bilirubin Total: 0.7 mg/dL (ref 0.0–1.2)
TOTAL PROTEIN: 6.6 g/dL (ref 6.0–8.5)

## 2017-05-20 LAB — PSA: Prostate Specific Ag, Serum: 1.2 ng/mL (ref 0.0–4.0)

## 2017-05-20 LAB — BASIC METABOLIC PANEL
BUN/Creatinine Ratio: 10 (ref 9–20)
BUN: 12 mg/dL (ref 6–24)
CO2: 27 mmol/L (ref 20–29)
CREATININE: 1.18 mg/dL (ref 0.76–1.27)
Calcium: 9.4 mg/dL (ref 8.7–10.2)
Chloride: 101 mmol/L (ref 96–106)
GFR calc Af Amer: 82 mL/min/{1.73_m2} (ref >59)
GFR, EST NON AFRICAN AMERICAN: 71 mL/min/{1.73_m2} (ref >59)
Glucose: 104 mg/dL — ABNORMAL HIGH (ref 65–99)
Potassium: 4.5 mmol/L (ref 3.5–5.2)
SODIUM: 145 mmol/L — AB (ref 134–144)

## 2017-05-30 ENCOUNTER — Other Ambulatory Visit: Payer: Self-pay | Admitting: Family Medicine

## 2017-05-31 ENCOUNTER — Encounter: Payer: Self-pay | Admitting: Family Medicine

## 2017-05-31 ENCOUNTER — Ambulatory Visit (INDEPENDENT_AMBULATORY_CARE_PROVIDER_SITE_OTHER): Payer: BC Managed Care – PPO | Admitting: Family Medicine

## 2017-05-31 VITALS — BP 142/88 | Ht 67.0 in | Wt 199.0 lb

## 2017-05-31 DIAGNOSIS — Z0001 Encounter for general adult medical examination with abnormal findings: Secondary | ICD-10-CM | POA: Diagnosis not present

## 2017-05-31 DIAGNOSIS — E785 Hyperlipidemia, unspecified: Secondary | ICD-10-CM | POA: Diagnosis not present

## 2017-05-31 DIAGNOSIS — I10 Essential (primary) hypertension: Secondary | ICD-10-CM

## 2017-05-31 DIAGNOSIS — Z23 Encounter for immunization: Secondary | ICD-10-CM | POA: Diagnosis not present

## 2017-05-31 DIAGNOSIS — Z0000 Encounter for general adult medical examination without abnormal findings: Secondary | ICD-10-CM

## 2017-05-31 MED ORDER — LOSARTAN POTASSIUM 100 MG PO TABS: 100.0000 mg | ORAL_TABLET | Freq: Every day | ORAL | 5 refills | Status: DC

## 2017-05-31 MED ORDER — SIMVASTATIN 20 MG PO TABS: 20.0000 mg | ORAL_TABLET | Freq: Every day | ORAL | 5 refills | Status: DC

## 2017-05-31 NOTE — Progress Notes (Signed)
Subjective:    Patient ID: Scott Meyers, male    DOB: 07-Jun-1966, 51 y.o.   MRN: [id number]  HPI The patient comes in today for a wellness visit.    A review of their health history was completed.  A review of medications was also completed.  Any needed refills; yes all meds  Eating habits: health conscious  Falls/  MVA accidents in past few months: none  Regular exercise: walking  Specialist pt sees on regular basis: none  Preventative health issues were discussed.   Additional concerns: none  Blood pressure medicine and blood pressure levels reviewed today with patient. Compliant with blood pressure medicine. States does not miss a dose. No obvious side effects. Blood pressure generally good when checked elsewhere. Watching salt intake.   Patient continues to take lipid medication regularly. No obvious side effects from it. Generally does not miss a dose. Prior blood work results are reviewed with patient. Patient continues to work on fat intake in diet  Has retired, overall feeling btter with more energy   BP not cking elsewhere  Flu shot good idea  Diet issues doing better , has cut back   Walking a lot good exercise   Results for orders placed or performed in visit on 05/13/17  Lipid panel  Result Value Ref Range   Cholesterol, Total 179 100 - 199 mg/dL   Triglycerides 177 (H) 0 - 149 mg/dL   HDL 46 >39 mg/dL   VLDL Cholesterol Cal 35 5 - 40 mg/dL   LDL Calculated 98 0 - 99 mg/dL   Chol/HDL Ratio 3.9 0.0 - 5.0 ratio  Hepatic function panel  Result Value Ref Range   Total Protein 6.6 6.0 - 8.5 g/dL   Albumin 4.5 3.5 - 5.5 g/dL   Bilirubin Total 0.7 0.0 - 1.2 mg/dL   Bilirubin, Direct 0.19 0.00 - 0.40 mg/dL   Alkaline Phosphatase 69 39 - 117 IU/L   AST 38 0 - 40 IU/L   ALT 59 (H) 0 - 44 IU/L  Basic metabolic panel  Result Value Ref Range   Glucose 104 (H) 65 - 99 mg/dL   BUN 12 6 - 24 mg/dL   Creatinine, Ser 1.18 0.76 - 1.27 mg/dL   GFR calc non Af  Amer 71 >59 mL/min/1.73   GFR calc Af Amer 82 >59 mL/min/1.73   BUN/Creatinine Ratio 10 9 - 20   Sodium 145 (H) 134 - 144 mmol/L   Potassium 4.5 3.5 - 5.2 mmol/L   Chloride 101 96 - 106 mmol/L   CO2 27 20 - 29 mmol/L   Calcium 9.4 8.7 - 10.2 mg/dL  PSA  Result Value Ref Range   Prostate Specific Ag, Serum 1.2 0.0 - 4.0 ng/mL   No fam hx of mo or f a diabetrs, no history      Review of Systems  Constitutional: Negative for activity change, appetite change and fever.  HENT: Negative for congestion and rhinorrhea.   Eyes: Negative for discharge.  Respiratory: Negative for cough and wheezing.   Cardiovascular: Negative for chest pain.  Gastrointestinal: Negative for abdominal pain, blood in stool and vomiting.  Genitourinary: Negative for difficulty urinating and frequency.  Musculoskeletal: Negative for neck pain.  Skin: Negative for rash.  Allergic/Immunologic: Negative for environmental allergies and food allergies.  Neurological: Negative for weakness and headaches.  Psychiatric/Behavioral: Negative for agitation.  All other systems reviewed and are negative.      Objective:   Physical Exam  Constitutional:  He appears well-developed and well-nourished.  HENT:  Head: Normocephalic and atraumatic.  Right Ear: External ear normal.  Left Ear: External ear normal.  Nose: Nose normal.  Mouth/Throat: Oropharynx is clear and moist.  Eyes: EOM are normal. Pupils are equal, round, and reactive to light.  Neck: Normal range of motion. Neck supple. No thyromegaly present.  Cardiovascular: Normal rate, regular rhythm and normal heart sounds.  No murmur heard. Pulmonary/Chest: Effort normal and breath sounds normal. No respiratory distress. He has no wheezes.  Abdominal: Soft. Bowel sounds are normal. He exhibits no distension and no mass. There is no tenderness.  Genitourinary: Penis normal.  Genitourinary Comments: Prostate exam within normal limits.  Musculoskeletal: Normal  range of motion. He exhibits no edema.  Lymphadenopathy:    He has no cervical adenopathy.  Neurological: He is alert. He exhibits normal muscle tone.  Skin: Skin is warm and dry. No erythema.  Psychiatric: He has a normal mood and affect. His behavior is normal. Judgment normal.  Vitals reviewed.         Assessment & Plan:  Impression wellness exam.  Diet exercise discussed.  Up-to-date on colonoscopy.  Blood work discussed.  2.  Hypertension good control discussed maintain same medications.  3.  Colon cancer.  Cancerous polyp with surgical excision and clean margins.  Patient not sure when next colonoscopy due.  Last one done within 1 year.  As we approached the 2-year mark will clarify need for further colonoscopies interval

## 2017-05-31 NOTE — Patient Instructions (Signed)
Results for orders placed or performed in visit on 05/13/17  Lipid panel  Result Value Ref Range   Cholesterol, Total 179 100 - 199 mg/dL   Triglycerides 177 (H) 0 - 149 mg/dL   HDL 46 >39 mg/dL   VLDL Cholesterol Cal 35 5 - 40 mg/dL   LDL Calculated 98 0 - 99 mg/dL   Chol/HDL Ratio 3.9 0.0 - 5.0 ratio  Hepatic function panel  Result Value Ref Range   Total Protein 6.6 6.0 - 8.5 g/dL   Albumin 4.5 3.5 - 5.5 g/dL   Bilirubin Total 0.7 0.0 - 1.2 mg/dL   Bilirubin, Direct 0.19 0.00 - 0.40 mg/dL   Alkaline Phosphatase 69 39 - 117 IU/L   AST 38 0 - 40 IU/L   ALT 59 (H) 0 - 44 IU/L  Basic metabolic panel  Result Value Ref Range   Glucose 104 (H) 65 - 99 mg/dL   BUN 12 6 - 24 mg/dL   Creatinine, Ser 1.18 0.76 - 1.27 mg/dL   GFR calc non Af Amer 71 >59 mL/min/1.73   GFR calc Af Amer 82 >59 mL/min/1.73   BUN/Creatinine Ratio 10 9 - 20   Sodium 145 (H) 134 - 144 mmol/L   Potassium 4.5 3.5 - 5.2 mmol/L   Chloride 101 96 - 106 mmol/L   CO2 27 20 - 29 mmol/L   Calcium 9.4 8.7 - 10.2 mg/dL  PSA  Result Value Ref Range   Prostate Specific Ag, Serum 1.2 0.0 - 4.0 ng/mL

## 2017-09-30 ENCOUNTER — Encounter (INDEPENDENT_AMBULATORY_CARE_PROVIDER_SITE_OTHER): Payer: Self-pay | Admitting: *Deleted

## 2017-10-07 ENCOUNTER — Other Ambulatory Visit (INDEPENDENT_AMBULATORY_CARE_PROVIDER_SITE_OTHER): Payer: Self-pay | Admitting: *Deleted

## 2017-10-07 DIAGNOSIS — Z85038 Personal history of other malignant neoplasm of large intestine: Secondary | ICD-10-CM

## 2017-11-09 ENCOUNTER — Telehealth (INDEPENDENT_AMBULATORY_CARE_PROVIDER_SITE_OTHER): Payer: Self-pay | Admitting: *Deleted

## 2017-11-09 ENCOUNTER — Encounter (INDEPENDENT_AMBULATORY_CARE_PROVIDER_SITE_OTHER): Payer: Self-pay | Admitting: *Deleted

## 2017-11-09 MED ORDER — PEG 3350-KCL-NA BICARB-NACL 420 G PO SOLR: 4000.0000 mL | Freq: Once | ORAL | 0 refills | Status: AC

## 2017-11-09 NOTE — Telephone Encounter (Signed)
Patient needs trilyte 

## 2017-11-11 ENCOUNTER — Telehealth: Payer: Self-pay | Admitting: Family Medicine

## 2017-11-11 DIAGNOSIS — E785 Hyperlipidemia, unspecified: Secondary | ICD-10-CM

## 2017-11-11 DIAGNOSIS — Z79899 Other long term (current) drug therapy: Secondary | ICD-10-CM

## 2017-11-11 DIAGNOSIS — R7303 Prediabetes: Secondary | ICD-10-CM

## 2017-11-11 DIAGNOSIS — I10 Essential (primary) hypertension: Secondary | ICD-10-CM

## 2017-11-11 NOTE — Telephone Encounter (Signed)
Patient is requesting lab work has wellness on 5/20.

## 2017-11-11 NOTE — Telephone Encounter (Signed)
Last labs 05/2017: Lipid, Liver, Met 7 and PSA

## 2017-11-12 NOTE — Addendum Note (Signed)
Addended by: Dairl Ponder on: 11/12/2017 10:32 AM   Modules accepted: Orders

## 2017-11-12 NOTE — Telephone Encounter (Signed)
Lip liv glu 

## 2017-11-12 NOTE — Telephone Encounter (Signed)
Blood work ordered in Epic. Patient notified. 

## 2017-11-16 ENCOUNTER — Telehealth (INDEPENDENT_AMBULATORY_CARE_PROVIDER_SITE_OTHER): Payer: Self-pay | Admitting: *Deleted

## 2017-11-16 NOTE — Telephone Encounter (Signed)
agree

## 2017-11-16 NOTE — Telephone Encounter (Signed)
Referring MD/PCP: steve luking   Procedure: tcs  Reason/Indication:  hx colon ca  Has patient had this procedure before?  yes, 09/2016             If so, when, by whom and where?    Is there a family history of colon cancer?  no             Who?  What age when diagnosed?    Is patient diabetic?   no                                                  Does patient have prosthetic heart valve or mechanical valve?  no  Do you have a pacemaker?  no  Has patient ever had endocarditis? no  Has patient had joint replacement within last 12 months?  no  Does patient tend to be constipated or take laxatives? no  Does patient have a history of alcohol/drug use?  An occasional beer  Is patient on Coumadin, Plavix and/or Aspirin? no  Medications: simvastatin 20 mg daily, losartan 100 mg daily  Allergies: nkda  Medication Adjustment:   Procedure date & time: 12/16/17 at 915

## 2017-11-20 LAB — LIPID PANEL
CHOLESTEROL TOTAL: 172 mg/dL (ref 100–199)
Chol/HDL Ratio: 3.1 ratio (ref 0.0–5.0)
HDL: 56 mg/dL (ref >39)
LDL CALC: 99 mg/dL (ref 0–99)
Triglycerides: 87 mg/dL (ref 0–149)
VLDL CHOLESTEROL CAL: 17 mg/dL (ref 5–40)

## 2017-11-20 LAB — HEPATIC FUNCTION PANEL
ALBUMIN: 4.5 g/dL (ref 3.5–5.5)
ALK PHOS: 65 IU/L (ref 39–117)
ALT: 32 IU/L (ref 0–44)
AST: 25 IU/L (ref 0–40)
Bilirubin Total: 0.6 mg/dL (ref 0.0–1.2)
Bilirubin, Direct: 0.18 mg/dL (ref 0.00–0.40)
Total Protein: 6.6 g/dL (ref 6.0–8.5)

## 2017-11-20 LAB — GLUCOSE, RANDOM: GLUCOSE: 107 mg/dL — AB (ref 65–99)

## 2017-11-26 ENCOUNTER — Other Ambulatory Visit: Payer: Self-pay | Admitting: Family Medicine

## 2017-11-29 ENCOUNTER — Ambulatory Visit: Payer: BC Managed Care – PPO | Admitting: Family Medicine

## 2017-11-29 VITALS — BP 118/82 | Ht 67.0 in | Wt 191.2 lb

## 2017-11-29 DIAGNOSIS — I10 Essential (primary) hypertension: Secondary | ICD-10-CM | POA: Diagnosis not present

## 2017-11-29 DIAGNOSIS — R7303 Prediabetes: Secondary | ICD-10-CM

## 2017-11-29 DIAGNOSIS — E785 Hyperlipidemia, unspecified: Secondary | ICD-10-CM

## 2017-11-29 MED ORDER — SIMVASTATIN 20 MG PO TABS: 20.0000 mg | ORAL_TABLET | Freq: Every day | ORAL | 5 refills | Status: DC

## 2017-11-29 MED ORDER — LOSARTAN POTASSIUM 100 MG PO TABS: 100.0000 mg | ORAL_TABLET | Freq: Every day | ORAL | 5 refills | Status: DC

## 2017-11-29 NOTE — Progress Notes (Signed)
   Subjective:    Patient ID: Scott Meyers, male    DOB: 1966/01/12, 52 y.o.   MRN: [id number]  Hypertension  This is a chronic problem. The current episode started more than 1 year ago. Risk factors for coronary artery disease include dyslipidemia. Treatments tried: losartin. There are no compliance problems.   Blood pressure medicine and blood pressure levels reviewed today with patient. Compliant with blood pressure medicine. States does not miss a dose. No obvious side effects. Blood pressure generally good when checked elsewhere. Watching salt intake.  Due to have f u colonoscopy in June for f uw with h of colon cancer  Walking a lot watching diet  Results for orders placed or performed in visit on 11/11/17  Lipid panel  Result Value Ref Range   Cholesterol, Total 172 100 - 199 mg/dL   Triglycerides 87 0 - 149 mg/dL   HDL 56 >39 mg/dL   VLDL Cholesterol Cal 17 5 - 40 mg/dL   LDL Calculated 99 0 - 99 mg/dL   Chol/HDL Ratio 3.1 0.0 - 5.0 ratio  Hepatic function panel  Result Value Ref Range   Total Protein 6.6 6.0 - 8.5 g/dL   Albumin 4.5 3.5 - 5.5 g/dL   Bilirubin Total 0.6 0.0 - 1.2 mg/dL   Bilirubin, Direct 0.18 0.00 - 0.40 mg/dL   Alkaline Phosphatase 65 39 - 117 IU/L   AST 25 0 - 40 IU/L   ALT 32 0 - 44 IU/L  Glucose, random  Result Value Ref Range   Glucose 107 (H) 65 - 99 mg/dL   Patient continues to take lipid medication regularly. No obvious side effects from it. Generally does not miss a dose. Prior blood work results are reviewed with patient. Patient continues to work on fat intake in diet   Walking a lot   No fam hx of diabetes  .pre  Review of Systems No headache, no major weight loss or weight gain, no chest pain no back pain abdominal pain no change in bowel habits complete ROS otherwise negative     Objective:   Physical Exam  Alert vitals stable, NAD. Blood pressure good on repeat. HEENT normal. Lungs clear. Heart regular rate and rhythm.      Assessment & Plan:  1 impression hypertension.  Good control discussed to maintain same meds  2.  Hyperlipidemia.  Blood work reviewed.  Compliance reviewed.  To maintain same meds rationale discussed  Diet exercise/discussed/medicines prescribed follow-up in 6 months

## 2017-11-29 NOTE — Patient Instructions (Signed)
Results for orders placed or performed in visit on 11/11/17  Lipid panel  Result Value Ref Range   Cholesterol, Total 172 100 - 199 mg/dL   Triglycerides 87 0 - 149 mg/dL   HDL 56 >39 mg/dL   VLDL Cholesterol Cal 17 5 - 40 mg/dL   LDL Calculated 99 0 - 99 mg/dL   Chol/HDL Ratio 3.1 0.0 - 5.0 ratio  Hepatic function panel  Result Value Ref Range   Total Protein 6.6 6.0 - 8.5 g/dL   Albumin 4.5 3.5 - 5.5 g/dL   Bilirubin Total 0.6 0.0 - 1.2 mg/dL   Bilirubin, Direct 0.18 0.00 - 0.40 mg/dL   Alkaline Phosphatase 65 39 - 117 IU/L   AST 25 0 - 40 IU/L   ALT 32 0 - 44 IU/L  Glucose, random  Result Value Ref Range   Glucose 107 (H) 65 - 99 mg/dL

## 2017-12-16 ENCOUNTER — Encounter (HOSPITAL_COMMUNITY)
Admission: RE | Disposition: A | Discharge status: Home / Self Care | Payer: Self-pay | Source: Ambulatory Visit | Attending: Internal Medicine

## 2017-12-16 ENCOUNTER — Ambulatory Visit (HOSPITAL_COMMUNITY)
Admission: RE | Admit: 2017-12-16 | Discharge: 2017-12-16 | Disposition: A | Discharge status: Home / Self Care | Payer: BC Managed Care – PPO | Source: Ambulatory Visit | Attending: Internal Medicine | Admitting: Internal Medicine

## 2017-12-16 ENCOUNTER — Other Ambulatory Visit: Payer: Self-pay

## 2017-12-16 ENCOUNTER — Encounter (HOSPITAL_COMMUNITY): Payer: Self-pay | Admitting: *Deleted

## 2017-12-16 DIAGNOSIS — Z1211 Encounter for screening for malignant neoplasm of colon: Secondary | ICD-10-CM | POA: Diagnosis present

## 2017-12-16 DIAGNOSIS — K644 Residual hemorrhoidal skin tags: Secondary | ICD-10-CM

## 2017-12-16 DIAGNOSIS — Z980 Intestinal bypass and anastomosis status: Secondary | ICD-10-CM | POA: Insufficient documentation

## 2017-12-16 DIAGNOSIS — I10 Essential (primary) hypertension: Secondary | ICD-10-CM | POA: Insufficient documentation

## 2017-12-16 DIAGNOSIS — Z85038 Personal history of other malignant neoplasm of large intestine: Secondary | ICD-10-CM | POA: Diagnosis not present

## 2017-12-16 DIAGNOSIS — Z9049 Acquired absence of other specified parts of digestive tract: Secondary | ICD-10-CM | POA: Diagnosis not present

## 2017-12-16 DIAGNOSIS — Z08 Encounter for follow-up examination after completed treatment for malignant neoplasm: Secondary | ICD-10-CM | POA: Diagnosis not present

## 2017-12-16 DIAGNOSIS — E785 Hyperlipidemia, unspecified: Secondary | ICD-10-CM | POA: Insufficient documentation

## 2017-12-16 DIAGNOSIS — F17290 Nicotine dependence, other tobacco product, uncomplicated: Secondary | ICD-10-CM | POA: Insufficient documentation

## 2017-12-16 DIAGNOSIS — Z79899 Other long term (current) drug therapy: Secondary | ICD-10-CM | POA: Insufficient documentation

## 2017-12-16 HISTORY — DX: Essential (primary) hypertension: I10

## 2017-12-16 HISTORY — PX: COLONOSCOPY: SHX5424

## 2017-12-16 HISTORY — DX: Malignant (primary) neoplasm, unspecified: C80.1

## 2017-12-16 SURGERY — COLONOSCOPY
Anesthesia: Moderate Sedation

## 2017-12-16 MED ORDER — SODIUM CHLORIDE 0.9 % IV SOLN
INTRAVENOUS | Status: DC
  Administered 2017-12-16: 08:00:00 via INTRAVENOUS

## 2017-12-16 MED ORDER — MEPERIDINE HCL 50 MG/ML IJ SOLN
INTRAMUSCULAR | Status: AC
  Filled 2017-12-16: qty 1

## 2017-12-16 MED ORDER — STERILE WATER FOR IRRIGATION IR SOLN
Status: DC | PRN
  Administered 2017-12-16: 15 mL

## 2017-12-16 MED ORDER — MEPERIDINE HCL 50 MG/ML IJ SOLN
INTRAMUSCULAR | Status: DC | PRN
  Administered 2017-12-16 (×3): 25 mg via INTRAVENOUS

## 2017-12-16 MED ORDER — MIDAZOLAM HCL 5 MG/5ML IJ SOLN
INTRAMUSCULAR | Status: AC
  Filled 2017-12-16: qty 10

## 2017-12-16 MED ORDER — MIDAZOLAM HCL 5 MG/5ML IJ SOLN
INTRAMUSCULAR | Status: DC | PRN
  Administered 2017-12-16 (×4): 2 mg via INTRAVENOUS

## 2017-12-16 NOTE — Discharge Instructions (Signed)
Resume usual medications as before. Resume usual diet. No driving for 24 hours. Next colonoscopy in 3 years. Physician will call with results of blood test.       Colonoscopy, Adult, Care After This sheet gives you information about how to care for yourself after your procedure. Your doctor may also give you more specific instructions. If you have problems or questions, call your doctor. Follow these instructions at home: General instructions   For the first 24 hours after the procedure: ? Do not drive or use machinery. ? Do not sign important documents. ? Do not drink alcohol. ? Do your daily activities more slowly than normal. ? Eat foods that are soft and easy to digest. ? Rest often.  Take over-the-counter or prescription medicines only as told by your doctor.  It is up to you to get the results of your procedure. Ask your doctor, or the department performing the procedure, when your results will be ready. To help cramping and bloating:  Try walking around.  Put heat on your belly (abdomen) as told by your doctor. Use a heat source that your doctor recommends, such as a moist heat pack or a heating pad. ? Put a towel between your skin and the heat source. ? Leave the heat on for 20-30 minutes. ? Remove the heat if your skin turns bright red. This is especially important if you cannot feel pain, heat, or cold. You can get burned. Eating and drinking  Drink enough fluid to keep your pee (urine) clear or pale yellow.  Return to your normal diet as told by your doctor. Avoid heavy or fried foods that are hard to digest.  Avoid drinking alcohol for as long as told by your doctor. Contact a doctor if:  You have blood in your poop (stool) 2-3 days after the procedure. Get help right away if:  You have more than a small amount of blood in your poop.  You see large clumps of tissue (blood clots) in your poop.  Your belly is swollen.  You feel sick to your stomach  (nauseous).  You throw up (vomit).  You have a fever.  You have belly pain that gets worse, and medicine does not help your pain. This information is not intended to replace advice given to you by your health care provider. Make sure you discuss any questions you have with your health care provider. Document Released: 08/01/2010 Document Revised: 03/23/2016 Document Reviewed: 03/23/2016 Elsevier Interactive Patient Education  2017 [employer].

## 2017-12-16 NOTE — Op Note (Signed)
[facility] Patient Name: Scott Meyers Procedure Date: 12/16/2017 9:12 AM MRN: [id number] Date of Birth: [birth year] Attending MD: Hildred Laser , MD CSN: [id number] Age: 52 Admit Type: Outpatient Procedure:                Colonoscopy Indications:              High risk colon cancer surveillance: Personal                            history of colon cancer Providers:                Hildred Laser, MD, Charlsie Quest. Theda Sers RN, RN, Nelma Rothman, Technician Referring MD:             Grace Bushy. Wolfgang Phoenix, MD Medicines:                Meperidine 75 mg IV, Midazolam 8 mg IV Complications:            No immediate complications. Estimated Blood Loss:     Estimated blood loss: none. Procedure:                Pre-Anesthesia Assessment:                           - Prior to the procedure, a History and Physical                            was performed, and patient medications and                            allergies were reviewed. The patient's tolerance of                            previous anesthesia was also reviewed. The risks                            and benefits of the procedure and the sedation                            options and risks were discussed with the patient.                            All questions were answered, and informed consent                            was obtained. Prior Anticoagulants: The patient has                            taken no previous anticoagulant or antiplatelet                            agents. ASA Grade Assessment: I - A normal, healthy  patient. After reviewing the risks and benefits,                            the patient was deemed in satisfactory condition to                            undergo the procedure.                           After obtaining informed consent, the colonoscope                            was passed under direct vision. Throughout the                            procedure, the  patient's blood pressure, pulse, and                            oxygen saturations were monitored continuously. The                            [id number] ([id number]) scope was introduced through                            the anus and advanced to the the cecum, identified                            by appendiceal orifice and ileocecal valve. The                            colonoscopy was performed without difficulty. The                            patient tolerated the procedure well. The quality                            of the bowel preparation was adequate to identify                            polyps. The ileocecal valve, appendiceal orifice,                            and rectum were photographed. Scope In: 9:40:48 AM Scope Out: 9:58:48 AM Scope Withdrawal Time: 0 hours 13 minutes 8 seconds  Total Procedure Duration: 0 hours 18 minutes 0 seconds  Findings:      The perianal and digital rectal examinations were normal.      There was evidence of a prior end-to-side colo-colonic anastomosis at 20       cm proximal to the anus. This was patent and was characterized by       healthy appearing mucosa.      The exam was otherwise normal throughout the examined colon.      External hemorrhoids were found during retroflexion. The hemorrhoids       were small. Impression:               -  Patent end-to-side colo-colonic anastomosis,                            characterized by healthy appearing mucosa.                           - External hemorrhoids.                           - No specimens collected. Moderate Sedation:      Moderate (conscious) sedation was administered by the endoscopy nurse       and supervised by the endoscopist. The following parameters were       monitored: oxygen saturation, heart rate, blood pressure, CO2       capnography and response to care. Total physician intraservice time was       27 minutes. Recommendation:           - Patient has a contact number available  for                            emergencies. The signs and symptoms of potential                            delayed complications were discussed with the                            patient. Return to normal activities tomorrow.                            Written discharge instructions were provided to the                            patient.                           - Resume previous diet today.                           - Continue present medications.                           - Check CEA today.                           - Repeat colonoscopy in 3 years for surveillance. Procedure Code(s):        --- Professional ---                           [phone], Colonoscopy, flexible; diagnostic, including                            collection of specimen(s) by brushing or washing,                            when performed (separate procedure)  G0500, Moderate sedation services provided by the                            same physician or other qualified health care                            professional performing a gastrointestinal                            endoscopic service that sedation supports,                            requiring the presence of an independent trained                            observer to assist in the monitoring of the                            patient's level of consciousness and physiological                            status; initial 15 minutes of intra-service time;                            patient age 58 years or older (additional time may                            be reported with [phone], as appropriate)                           [phone], Moderate sedation services provided by the                            same physician or other qualified health care                            professional performing the diagnostic or                            therapeutic service that the sedation supports,                            requiring the presence  of an independent trained                            observer to assist in the monitoring of the                            patient's level of consciousness and physiological                            status; each additional 15 minutes intraservice  time (List separately in addition to code for                            primary service) Diagnosis Code(s):        --- Professional ---                           [phone], Personal history of other malignant                            neoplasm of large intestine                           Z98.0, Intestinal bypass and anastomosis status                           K64.4, Residual hemorrhoidal skin tags CPT copyright 2017 American Medical Association. All rights reserved. The codes documented in this report are preliminary and upon coder review may  be revised to meet current compliance requirements. Hildred Laser, MD Hildred Laser, MD 12/16/2017 10:06:49 AM This report has been signed electronically. Number of Addenda: 0

## 2017-12-16 NOTE — H&P (Signed)
Scott Meyers is an 52 y.o. male.   Chief Complaint: Patient is here for colonoscopy. HPI: Patient is 52 year old Caucasian male who underwent screening colonoscopy  in March 2018 and was found to have 2 adenomas.  Polyp in sigmoid colon was large with invasive carcinoma leading to sigmoid resection in May 2018.  He did not have any residual disease.  12 lymph nodes were removed and there were all negative.  He is doing fine.  He is here for surveillance exam.  He denies abdominal pain change in bowel habits or rectal bleeding. Family history is negative for CRC.  Past Medical History:  Diagnosis Date  . Cancer [facility])    Colon cancer  . Gout   . Hyperlipidemia   . Hyperlipidemia   . Hypertension   . Impaired fasting glucose     Past Surgical History:  Procedure Laterality Date  . APPENDECTOMY    . COLONOSCOPY N/A 09/10/2016   Procedure: COLONOSCOPY;  Surgeon: Rogene Houston, MD;  Location: [facility] ENDO SUITE;  Service: Endoscopy;  Laterality: N/A;  830  . PARTIAL COLECTOMY N/A 09/23/2016   Procedure: PARTIAL COLECTOMY;  Surgeon: Aviva Signs, MD;  Location: [facility] ORS;  Service: General;  Laterality: N/A;  . POLYPECTOMY  09/10/2016   Procedure: POLYPECTOMY;  Surgeon: Rogene Houston, MD;  Location: [facility] ENDO [facility];  Service: Endoscopy;;  colon    Family History  Problem Relation Age of Onset  . Cancer Mother   . Hypertension Father   . Cancer Father   . Colon cancer Neg Hx    Social History:  reports that he has never smoked. His smokeless tobacco use includes snuff. He reports that he drinks about 7.2 oz of alcohol per week. He reports that he does not use drugs.  Allergies: No Known Allergies  Medications Prior to Admission  Medication Sig Dispense Refill  . losartan (COZAAR) 100 MG tablet Take 1 tablet (100 mg total) by mouth daily. 30 tablet 5  . simvastatin (ZOCOR) 20 MG tablet Take 1 tablet (20 mg total) by mouth daily at 6 PM. 30 tablet 5    No results found for this or any  previous visit (from the past 48 hour(s)). No results found.  ROS  Blood pressure (!) 141/93, pulse 63, temperature 97.9 F (36.6 C), temperature source Oral, resp. rate 12, height 5\' 7"  (1.702 m), weight 191 lb (86.6 kg), SpO2 100 %. Physical Exam  Constitutional: He appears well-developed and well-nourished.  HENT:  Mouth/Throat: Oropharynx is clear and moist.  Eyes: Conjunctivae are normal. No scleral icterus.  Neck: No thyromegaly present.  Cardiovascular: Normal rate, regular rhythm and normal heart sounds.  No murmur heard. Respiratory: Effort normal and breath sounds normal.  GI:  Abdomen is symmetrical and soft with lower midline scar.  No organomegaly or masses.  Musculoskeletal: He exhibits no edema.  Lymphadenopathy:    He has no cervical adenopathy.  Neurological: He is alert.  Skin: Skin is warm and dry.     Assessment/Plan  History of colon carcinoma. Surveillance colonoscopy.  Hildred Laser, MD 12/16/2017, 9:27 AM

## 2017-12-17 LAB — CEA: CEA: 1.4 ng/mL (ref 0.0–4.7)

## 2017-12-23 ENCOUNTER — Encounter (HOSPITAL_COMMUNITY): Payer: Self-pay | Admitting: Internal Medicine

## 2018-05-13 ENCOUNTER — Telehealth: Payer: Self-pay | Admitting: Family Medicine

## 2018-05-13 DIAGNOSIS — I10 Essential (primary) hypertension: Secondary | ICD-10-CM

## 2018-05-13 DIAGNOSIS — Z125 Encounter for screening for malignant neoplasm of prostate: Secondary | ICD-10-CM

## 2018-05-13 DIAGNOSIS — C189 Malignant neoplasm of colon, unspecified: Secondary | ICD-10-CM

## 2018-05-13 DIAGNOSIS — E785 Hyperlipidemia, unspecified: Secondary | ICD-10-CM

## 2018-05-13 DIAGNOSIS — Z79899 Other long term (current) drug therapy: Secondary | ICD-10-CM

## 2018-05-13 NOTE — Telephone Encounter (Signed)
Orders placed. Tried call pt no answer. Left a message to r/c.

## 2018-05-13 NOTE — Telephone Encounter (Signed)
Patient notified

## 2018-05-13 NOTE — Telephone Encounter (Signed)
Lip liv met 7 psa cea (hx colon ca)

## 2018-05-13 NOTE — Telephone Encounter (Signed)
Pt would like to have his labs ordered and done before his CPE on 11/20. Dr. Laural Golden done his colonoscopy and suggested having his CEA checked as well.

## 2018-05-13 NOTE — Telephone Encounter (Signed)
Last labs 11/2017: Lipid, Liver and Glucose

## 2018-05-18 LAB — BASIC METABOLIC PANEL
BUN / CREAT RATIO: 13 (ref 9–20)
BUN: 15 mg/dL (ref 6–24)
CHLORIDE: 102 mmol/L (ref 96–106)
CO2: 23 mmol/L (ref 20–29)
Calcium: 9.5 mg/dL (ref 8.7–10.2)
Creatinine, Ser: 1.18 mg/dL (ref 0.76–1.27)
GFR calc non Af Amer: 71 mL/min/{1.73_m2} (ref >59)
GFR, EST AFRICAN AMERICAN: 82 mL/min/{1.73_m2} (ref >59)
Glucose: 115 mg/dL — ABNORMAL HIGH (ref 65–99)
POTASSIUM: 4.3 mmol/L (ref 3.5–5.2)
SODIUM: 141 mmol/L (ref 134–144)

## 2018-05-18 LAB — LIPID PANEL
CHOL/HDL RATIO: 3.8 ratio (ref 0.0–5.0)
CHOLESTEROL TOTAL: 173 mg/dL (ref 100–199)
HDL: 46 mg/dL (ref >39)
LDL Calculated: 76 mg/dL (ref 0–99)
TRIGLYCERIDES: 256 mg/dL — AB (ref 0–149)
VLDL Cholesterol Cal: 51 mg/dL — ABNORMAL HIGH (ref 5–40)

## 2018-05-18 LAB — HEPATIC FUNCTION PANEL
ALT: 33 IU/L (ref 0–44)
AST: 22 IU/L (ref 0–40)
Albumin: 4.5 g/dL (ref 3.5–5.5)
Alkaline Phosphatase: 64 IU/L (ref 39–117)
BILIRUBIN TOTAL: 1 mg/dL (ref 0.0–1.2)
BILIRUBIN, DIRECT: 0.24 mg/dL (ref 0.00–0.40)
Total Protein: 6.9 g/dL (ref 6.0–8.5)

## 2018-05-18 LAB — CEA: CEA: 1.6 ng/mL (ref 0.0–4.7)

## 2018-05-18 LAB — PSA: PROSTATE SPECIFIC AG, SERUM: 1.3 ng/mL (ref 0.0–4.0)

## 2018-06-01 ENCOUNTER — Encounter: Payer: Self-pay | Admitting: Family Medicine

## 2018-06-01 ENCOUNTER — Ambulatory Visit: Payer: BC Managed Care – PPO | Admitting: Family Medicine

## 2018-06-01 VITALS — BP 122/84 | Ht 67.0 in | Wt 195.0 lb

## 2018-06-01 DIAGNOSIS — I10 Essential (primary) hypertension: Secondary | ICD-10-CM | POA: Diagnosis not present

## 2018-06-01 DIAGNOSIS — Z23 Encounter for immunization: Secondary | ICD-10-CM

## 2018-06-01 DIAGNOSIS — Z0000 Encounter for general adult medical examination without abnormal findings: Secondary | ICD-10-CM

## 2018-06-01 DIAGNOSIS — R7303 Prediabetes: Secondary | ICD-10-CM | POA: Diagnosis not present

## 2018-06-01 DIAGNOSIS — E785 Hyperlipidemia, unspecified: Secondary | ICD-10-CM | POA: Diagnosis not present

## 2018-06-01 MED ORDER — LOSARTAN POTASSIUM 100 MG PO TABS: 100.0000 mg | ORAL_TABLET | Freq: Every day | ORAL | 5 refills | Status: DC

## 2018-06-01 MED ORDER — SIMVASTATIN 20 MG PO TABS: 20.0000 mg | ORAL_TABLET | Freq: Every day | ORAL | 5 refills | Status: DC

## 2018-06-01 NOTE — Patient Instructions (Signed)
Prediabetes Prediabetes is the condition of having a blood sugar (blood glucose) level that is higher than it should be, but not high enough for you to be diagnosed with type 2 diabetes. Having prediabetes puts you at risk for developing type 2 diabetes (type 2 diabetes mellitus). Prediabetes may be called impaired glucose tolerance or impaired fasting glucose. Prediabetes usually does not cause symptoms. Your health care provider can diagnose this condition with blood tests. You may be tested for prediabetes if you are overweight and if you have at least one other risk factor for prediabetes. Risk factors for prediabetes include:  Having a family member with type 2 diabetes.  Being overweight or obese.  Being older than age 57.  Being of American-Indian, African-American, Hispanic/Latino, or Asian/Pacific Islander descent.  Having an inactive (sedentary) lifestyle.  Having a history of gestational diabetes or polycystic ovarian syndrome (PCOS).  Having low levels of good cholesterol (HDL-C) or high levels of blood fats (triglycerides).  Having high blood pressure.  What is blood glucose and how is blood glucose measured?  Blood glucose refers to the amount of glucose in your bloodstream. Glucose comes from eating foods that contain sugars and starches (carbohydrates) that the body breaks down into glucose. Your blood glucose level may be measured in mg/dL (milligrams per deciliter) or mmol/L (millimoles per liter).Your blood glucose may be checked with one or more of the following blood tests:  A fasting blood glucose (FBG) test. You will not be allowed to eat (you will fast) for at least 8 hours before a blood sample is taken. ? A normal range for FBG is 70-100 mg/dl (3.9-5.6 mmol/L).  An A1c (hemoglobin A1c) blood test. This test provides information about blood glucose control over the previous 2?68month.  An oral glucose tolerance test (OGTT). This test measures your blood  glucose twice: ? After fasting. This is your baseline level. ? Two hours after you drink a beverage that contains glucose.  You may be diagnosed with prediabetes:  If your FBG is 100?125 mg/dL (5.6-6.9 mmol/L).  If your A1c level is 5.7?6.4%.  If your OGGT result is 140?199 mg/dL (7.8-11 mmol/L).  These blood tests may be repeated to confirm your diagnosis. What happens if blood glucose is too high? The pancreas produces a hormone (insulin) that helps move glucose from the bloodstream into cells. When cells in the body do not respond properly to insulin that the body makes (insulin resistance), excess glucose builds up in the blood instead of going into cells. As a result, high blood glucose (hyperglycemia) can develop, which can cause many complications. This is a symptom of prediabetes. What can happen if blood glucose stays higher than normal for a long time? Having high blood glucose for a long time is dangerous. Too much glucose in your blood can damage your nerves and blood vessels. Long-term damage can lead to complications from diabetes, which may include:  Heart disease.  Stroke.  Blindness.  Kidney disease.  Depression.  Poor circulation in the feet and legs, which could lead to surgical removal (amputation) in severe cases.  How can prediabetes be prevented from turning into type 2 diabetes?  To help prevent type 2 diabetes, take the following actions:  Be physically active. ? Do moderate-intensity physical activity for at least 30 minutes on at least 5 days of the week, or as much as told by your health care provider. This could be brisk walking, biking, or water aerobics. ? Ask your health care provider what  activities are safe for you. A mix of physical activities may be best, such as walking, swimming, cycling, and strength training.  Lose weight as told by your health care provider. ? Losing 5-7% of your body weight can reverse insulin resistance. ? Your health  care provider can determine how much weight loss is best for you and can help you lose weight safely.  Follow a healthy meal plan. This includes eating lean proteins, complex carbohydrates, fresh fruits and vegetables, low-fat dairy products, and healthy fats. ? Follow instructions from your health care provider about eating or drinking restrictions. ? Make an appointment to see a diet and nutrition specialist (registered dietitian) to help you create a healthy eating plan that is right for you.  Do not smoke or use any tobacco products, such as cigarettes, chewing tobacco, and e-cigarettes. If you need help quitting, ask your health care provider.  Take over-the-counter and prescription medicines as told by your health care provider. You may be prescribed medicines that help lower the risk of type 2 diabetes.  This information is not intended to replace advice given to you by your health care provider. Make sure you discuss any questions you have with your health care provider. Document Released: 10/21/2015 Document Revised: 12/05/2015 Document Reviewed: 08/20/2015 Elsevier Interactive Patient Education  2018 Elsevier Inc.  

## 2018-06-01 NOTE — Progress Notes (Signed)
Subjective:    Patient ID: Scott Meyers, male    DOB: 05-Aug-1965, 52 y.o.   MRN: [id number]  HPI The patient comes in today for a wellness visit.    A review of their health history was completed.  A review of medications was also completed.  Any needed refills; update refills  Eating habits: health conscious  Falls/  MVA accidents in past few months: none  Regular exercise: none  Specialist pt sees on regular basis: none  Preventative health issues were discussed.   Additional concerns: none   Wants flu vaccine.   Results for orders placed or performed in visit on 05/13/18  Lipid panel  Result Value Ref Range   Cholesterol, Total 173 100 - 199 mg/dL   Triglycerides 256 (H) 0 - 149 mg/dL   HDL 46 >39 mg/dL   VLDL Cholesterol Cal 51 (H) 5 - 40 mg/dL   LDL Calculated 76 0 - 99 mg/dL   Chol/HDL Ratio 3.8 0.0 - 5.0 ratio  Hepatic function panel  Result Value Ref Range   Total Protein 6.9 6.0 - 8.5 g/dL   Albumin 4.5 3.5 - 5.5 g/dL   Bilirubin Total 1.0 0.0 - 1.2 mg/dL   Bilirubin, Direct 0.24 0.00 - 0.40 mg/dL   Alkaline Phosphatase 64 39 - 117 IU/L   AST 22 0 - 40 IU/L   ALT 33 0 - 44 IU/L  Basic metabolic panel  Result Value Ref Range   Glucose 115 (H) 65 - 99 mg/dL   BUN 15 6 - 24 mg/dL   Creatinine, Ser 1.18 0.76 - 1.27 mg/dL   GFR calc non Af Amer 71 >59 mL/min/1.73   GFR calc Af Amer 82 >59 mL/min/1.73   BUN/Creatinine Ratio 13 9 - 20   Sodium 141 134 - 144 mmol/L   Potassium 4.3 3.5 - 5.2 mmol/L   Chloride 102 96 - 106 mmol/L   CO2 23 20 - 29 mmol/L   Calcium 9.5 8.7 - 10.2 mg/dL  PSA  Result Value Ref Range   Prostate Specific Ag, Serum 1.3 0.0 - 4.0 ng/mL  CEA  Result Value Ref Range   CEA 1.6 0.0 - 4.7 ng/mL   Blood pressure medicine and blood pressure levels reviewed today with patient. Compliant with blood pressure medicine. States does not miss a dose. No obvious side effects. Blood pressure generally good when checked elsewhere. Watching  salt intake.    Patient continues to take lipid medication regularly. No obvious side effects from it. Generally does not miss a dose. Prior blood work results are reviewed with patient. Patient continues to work on fat intake in diet   Review of Systems  Constitutional: Negative for activity change, appetite change and fever.  HENT: Negative for congestion and rhinorrhea.   Eyes: Negative for discharge.  Respiratory: Negative for cough and wheezing.   Cardiovascular: Negative for chest pain.  Gastrointestinal: Negative for abdominal pain, blood in stool and vomiting.  Genitourinary: Negative for difficulty urinating and frequency.  Musculoskeletal: Negative for neck pain.  Skin: Negative for rash.  Allergic/Immunologic: Negative for environmental allergies and food allergies.  Neurological: Negative for weakness and headaches.  Psychiatric/Behavioral: Negative for agitation.  All other systems reviewed and are negative.      Objective:   Physical Exam  Constitutional: He appears well-developed and well-nourished.  HENT:  Head: Normocephalic and atraumatic.  Right Ear: External ear normal.  Left Ear: External ear normal.  Nose: Nose normal.  Mouth/Throat: Oropharynx  is clear and moist.  Eyes: Pupils are equal, round, and reactive to light. EOM are normal.  Neck: Normal range of motion. Neck supple. No thyromegaly present.  Cardiovascular: Normal rate, regular rhythm and normal heart sounds.  No murmur heard. Pulmonary/Chest: Effort normal and breath sounds normal. No respiratory distress. He has no wheezes.  Abdominal: Soft. Bowel sounds are normal. He exhibits no distension and no mass. There is no tenderness.  Genitourinary: Penis normal.  Musculoskeletal: Normal range of motion. He exhibits no edema.  Lymphadenopathy:    He has no cervical adenopathy.  Neurological: He is alert. He exhibits normal muscle tone.  Skin: Skin is warm and dry. No erythema.  Psychiatric: He  has a normal mood and affect. His behavior is normal. Judgment normal.  Vitals reviewed.         Assessment & Plan:  Impression #1 wellness exam.  Diet discussed.  Exercise discussed.  Flu shot recommended and given.  Next colon due 2022.  Of note CEA within normal limits.  Patient status post colon cancer diagnosis  2.  Hypertension good control discussed maintain same meds graph #3 hyperlipidemia.  Good control discussed maintain same meds  4.  Prediabetes.  Glucose stable still substantially elevated.  Strongly encouraged to cut back on sugars  Follow-up in 6 months diet exercise discussed flu shot

## 2018-06-08 ENCOUNTER — Encounter: Payer: Self-pay | Admitting: Family Medicine

## 2018-06-08 ENCOUNTER — Telehealth: Payer: Self-pay | Admitting: Family Medicine

## 2018-06-08 ENCOUNTER — Ambulatory Visit: Payer: BC Managed Care – PPO | Admitting: Family Medicine

## 2018-06-08 VITALS — BP 148/108 | Ht 67.0 in | Wt 202.0 lb

## 2018-06-08 DIAGNOSIS — M109 Gout, unspecified: Secondary | ICD-10-CM | POA: Diagnosis not present

## 2018-06-08 DIAGNOSIS — J019 Acute sinusitis, unspecified: Secondary | ICD-10-CM

## 2018-06-08 MED ORDER — AMOXICILLIN 500 MG PO TABS: 500.0000 mg | ORAL_TABLET | Freq: Three times a day (TID) | ORAL | 0 refills | Status: DC

## 2018-06-08 MED ORDER — PREDNISONE 20 MG PO TABS: ORAL_TABLET | ORAL | 0 refills | Status: DC

## 2018-06-08 NOTE — Telephone Encounter (Signed)
Pt would like a prescription of  predniSONE (DELTASONE) 20 MG tablet for cough/congestion, pt stated he feels as these are the same symptoms he experienced in the past when prescribed the listed medication. Advise.     [id number] [place] [place] [facility] [address]

## 2018-06-08 NOTE — Telephone Encounter (Signed)
Patient given appointment today for evaluation and treatment
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
## 2018-06-08 NOTE — Patient Instructions (Signed)

## 2018-06-14 ENCOUNTER — Telehealth: Payer: Self-pay | Admitting: Family Medicine

## 2018-06-14 NOTE — Telephone Encounter (Signed)
Pt would like a nurse to call him to go over blood work results. Advise.

## 2018-06-14 NOTE — Telephone Encounter (Signed)
I spoke with the pt and he said he was just wondering if we will call him with his result when they are back.I advised yes we will call him with results,but should it be a week and he hears nothing to call us to find out what results were.

## 2018-06-14 NOTE — Telephone Encounter (Signed)
I called and left a message with his wife to have pt r/c to our office.

## 2018-06-15 LAB — URIC ACID: URIC ACID: 7 mg/dL (ref 3.7–8.6)

## 2018-06-16 ENCOUNTER — Encounter: Payer: Self-pay | Admitting: Family Medicine

## 2018-07-11 ENCOUNTER — Other Ambulatory Visit: Payer: Self-pay | Admitting: Family Medicine

## 2018-07-11 MED ORDER — PREDNISONE 20 MG PO TABS: ORAL_TABLET | ORAL | 0 refills | Status: DC

## 2018-07-11 NOTE — Telephone Encounter (Signed)
Adult pred taper 

## 2018-07-11 NOTE — Telephone Encounter (Signed)
Seen for gout in foot 05/2018

## 2018-07-11 NOTE — Telephone Encounter (Signed)
Medication sent in to [facility]. I called and left a message to r/c.

## 2018-07-11 NOTE — Telephone Encounter (Signed)
Pt is requesting a refill on predniSONE (DELTASONE) 20 MG tablet. He thinks he is having a gout flair up. Please send to [id number] - [place], [place] - [address]

## 2018-07-11 NOTE — Telephone Encounter (Signed)
Pt returned call. Pt informed that medication was sent to [facility]. Pt verbalized understanding.

## 2018-08-19 ENCOUNTER — Telehealth: Payer: Self-pay | Admitting: Family Medicine

## 2018-08-19 MED ORDER — PREDNISONE 20 MG PO TABS: ORAL_TABLET | ORAL | 0 refills | Status: DC

## 2018-08-19 NOTE — Telephone Encounter (Signed)
Adult pred tgaper

## 2018-08-19 NOTE — Telephone Encounter (Signed)
Prescription sent electronically to pharmacy. Patient notified. 

## 2018-08-19 NOTE — Telephone Encounter (Signed)
Pt requesting meds for gout, states it flared up the other day & is getting worse  Please advise & call pt     [facility]

## 2018-09-28 ENCOUNTER — Other Ambulatory Visit: Payer: Self-pay | Admitting: Family Medicine

## 2018-09-28 NOTE — Telephone Encounter (Signed)
Six mo worth 

## 2018-09-29 ENCOUNTER — Other Ambulatory Visit: Payer: Self-pay | Admitting: Family Medicine

## 2018-09-29 NOTE — Telephone Encounter (Signed)
**  Please clarify directions. Thank you**

## 2018-09-29 NOTE — Telephone Encounter (Signed)
Pharmacy states Pharmacy comment: Product Backordered/Unavailable:WE DO NOT HAVE LOSARTAN POTASSIUM 25 50 AND 100MG  TAB CAN YOU CHANGE THIS BLOOD PRESSURE. The recommend Avapro please advise

## 2018-09-29 NOTE — Telephone Encounter (Signed)
Would be fine to change this give a 30-day with 5 refills Pharmacy needs to let the patient understand why Please make sure epic reflects this

## 2018-09-29 NOTE — Telephone Encounter (Signed)
Left message to return call to let pt know of change.

## 2018-09-29 NOTE — Telephone Encounter (Signed)
300 mg 1 daily in place of losartan No. 30 with 5 refills

## 2018-09-30 ENCOUNTER — Other Ambulatory Visit: Payer: Self-pay | Admitting: Family Medicine

## 2018-09-30 ENCOUNTER — Other Ambulatory Visit: Payer: Self-pay | Admitting: *Deleted

## 2018-09-30 MED ORDER — IRBESARTAN 300 MG PO TABS: 300.0000 mg | ORAL_TABLET | Freq: Every day | ORAL | 5 refills | Status: DC

## 2018-09-30 MED ORDER — SIMVASTATIN 20 MG PO TABS: 20.0000 mg | ORAL_TABLET | Freq: Every day | ORAL | 5 refills | Status: DC

## 2018-09-30 NOTE — Addendum Note (Signed)
Addended by: Dairl Ponder on: 09/30/2018 10:51 AM   Modules accepted: Orders

## 2018-11-28 ENCOUNTER — Ambulatory Visit: Payer: BC Managed Care – PPO | Admitting: Family Medicine

## 2018-12-02 ENCOUNTER — Telehealth: Payer: Self-pay | Admitting: Family Medicine

## 2018-12-02 MED ORDER — PREDNISONE 20 MG PO TABS: ORAL_TABLET | ORAL | 0 refills | Status: DC

## 2018-12-02 NOTE — Telephone Encounter (Signed)
Adult pred taper 

## 2018-12-02 NOTE — Telephone Encounter (Signed)
Pt has history of gout in toe. Please advise. Thank you

## 2018-12-02 NOTE — Telephone Encounter (Signed)
Pt returned call and verbalized understanding  

## 2018-12-02 NOTE — Telephone Encounter (Signed)
Pt states he is having a flair up of turf toe. His big toe on right foot is aching and would like prednisone called in to [id number] [place], [place] [address]

## 2018-12-02 NOTE — Telephone Encounter (Signed)
Medication sent in. Left message to return call 

## 2019-03-30 ENCOUNTER — Other Ambulatory Visit: Payer: Self-pay | Admitting: Family Medicine

## 2019-04-07 ENCOUNTER — Telehealth: Payer: Self-pay | Admitting: Family Medicine

## 2019-04-07 DIAGNOSIS — I10 Essential (primary) hypertension: Secondary | ICD-10-CM

## 2019-04-07 DIAGNOSIS — E785 Hyperlipidemia, unspecified: Secondary | ICD-10-CM

## 2019-04-07 DIAGNOSIS — C187 Malignant neoplasm of sigmoid colon: Secondary | ICD-10-CM

## 2019-04-07 DIAGNOSIS — Z125 Encounter for screening for malignant neoplasm of prostate: Secondary | ICD-10-CM

## 2019-04-07 DIAGNOSIS — Z79899 Other long term (current) drug therapy: Secondary | ICD-10-CM

## 2019-04-07 NOTE — Telephone Encounter (Signed)
Last labs completed 05/17/18 CEA, PSA, Bmet, lipid and liver. Please advise. Thank you

## 2019-04-07 NOTE — Telephone Encounter (Signed)
Patient requesting complete lab work up.

## 2019-04-09 NOTE — Telephone Encounter (Signed)
same

## 2019-04-10 NOTE — Addendum Note (Signed)
Addended by: Vicente Males on: 04/10/2019 04:03 PM   Modules accepted: Orders

## 2019-04-10 NOTE — Telephone Encounter (Signed)
Lab orders placed. Left message to return call  

## 2019-04-11 NOTE — Telephone Encounter (Signed)
Pt.notified

## 2019-04-14 LAB — LIPID PANEL
Chol/HDL Ratio: 3.5 ratio (ref 0.0–5.0)
Cholesterol, Total: 183 mg/dL (ref 100–199)
HDL: 53 mg/dL (ref >39)
LDL Chol Calc (NIH): 100 mg/dL — ABNORMAL HIGH (ref 0–99)
Triglycerides: 176 mg/dL — ABNORMAL HIGH (ref 0–149)
VLDL Cholesterol Cal: 30 mg/dL (ref 5–40)

## 2019-04-14 LAB — BASIC METABOLIC PANEL
BUN/Creatinine Ratio: 13 (ref 9–20)
BUN: 16 mg/dL (ref 6–24)
CO2: 23 mmol/L (ref 20–29)
Calcium: 9.3 mg/dL (ref 8.7–10.2)
Chloride: 102 mmol/L (ref 96–106)
Creatinine, Ser: 1.28 mg/dL — ABNORMAL HIGH (ref 0.76–1.27)
GFR calc Af Amer: 73 mL/min/{1.73_m2} (ref >59)
GFR calc non Af Amer: 63 mL/min/{1.73_m2} (ref >59)
Glucose: 110 mg/dL — ABNORMAL HIGH (ref 65–99)
Potassium: 4.5 mmol/L (ref 3.5–5.2)
Sodium: 141 mmol/L (ref 134–144)

## 2019-04-14 LAB — HEPATIC FUNCTION PANEL
ALT: 37 IU/L (ref 0–44)
AST: 24 IU/L (ref 0–40)
Albumin: 4.7 g/dL (ref 3.8–4.9)
Alkaline Phosphatase: 82 IU/L (ref 39–117)
Bilirubin Total: 1 mg/dL (ref 0.0–1.2)
Bilirubin, Direct: 0.24 mg/dL (ref 0.00–0.40)
Total Protein: 6.8 g/dL (ref 6.0–8.5)

## 2019-04-14 LAB — CEA: CEA: 1.5 ng/mL (ref 0.0–4.7)

## 2019-04-14 LAB — PSA: Prostate Specific Ag, Serum: 1.2 ng/mL (ref 0.0–4.0)

## 2019-04-19 ENCOUNTER — Encounter: Payer: Self-pay | Admitting: Family Medicine

## 2019-04-19 ENCOUNTER — Other Ambulatory Visit: Payer: Self-pay

## 2019-04-19 ENCOUNTER — Ambulatory Visit (INDEPENDENT_AMBULATORY_CARE_PROVIDER_SITE_OTHER): Payer: BC Managed Care – PPO | Admitting: Family Medicine

## 2019-04-19 DIAGNOSIS — I10 Essential (primary) hypertension: Secondary | ICD-10-CM

## 2019-04-19 DIAGNOSIS — E785 Hyperlipidemia, unspecified: Secondary | ICD-10-CM

## 2019-04-19 MED ORDER — SIMVASTATIN 20 MG PO TABS: 20.0000 mg | ORAL_TABLET | Freq: Every day | ORAL | 1 refills | Status: DC

## 2019-04-19 MED ORDER — IRBESARTAN 300 MG PO TABS: ORAL_TABLET | ORAL | 1 refills | Status: DC

## 2019-04-19 NOTE — Progress Notes (Signed)
Subjective:  Audio only  Patient ID: Scott Meyers, male    DOB: 21-Jan-1966, 53 y.o.   MRN: [id number]  Hypertension This is a chronic problem. There are no compliance problems.   Hyperlipidemia This is a chronic problem.  Pt states he does check his blood pressure at home every so often. No issues or concerns.   Virtual Visit via Video Note  I connected with Scott Meyers on 04/19/19 at  8:30 AM EDT by a video enabled telemedicine application and verified that I am speaking with the correct person using two identifiers.  Location: Patient: home Provider: office   I discussed the limitations of evaluation and management by telemedicine and the availability of in person appointments. The patient expressed understanding and agreed to proceed.  History of Present Illness:    Observations/Objective:   Assessment and Plan:   Follow Up Instructions:    I discussed the assessment and treatment plan with the patient. The patient was provided an opportunity to ask questions and all were answered. The patient agreed with the plan and demonstrated an understanding of the instructions.   The patient was advised to call back or seek an in-person evaluation if the symptoms worsen or if the condition fails to improve as anticipated.  I provided 18 minutes of non-face-to-face time during this encounter. Results for orders placed or performed in visit on 04/07/19  CEA  Result Value Ref Range   CEA 1.5 0.0 - 4.7 ng/mL  PSA  Result Value Ref Range   Prostate Specific Ag, Serum 1.2 0.0 - 4.0 ng/mL  Basic Metabolic Panel (BMET)  Result Value Ref Range   Glucose 110 (H) 65 - 99 mg/dL   BUN 16 6 - 24 mg/dL   Creatinine, Ser 1.28 (H) 0.76 - 1.27 mg/dL   GFR calc non Af Amer 63 >59 mL/min/1.73   GFR calc Af Amer 73 >59 mL/min/1.73   BUN/Creatinine Ratio 13 9 - 20   Sodium 141 134 - 144 mmol/L   Potassium 4.5 3.5 - 5.2 mmol/L   Chloride 102 96 - 106 mmol/L   CO2 23 20 - 29 mmol/L   Calcium 9.3 8.7 - 10.2 mg/dL  Lipid Profile  Result Value Ref Range   Cholesterol, Total 183 100 - 199 mg/dL   Triglycerides 176 (H) 0 - 149 mg/dL   HDL 53 >39 mg/dL   VLDL Cholesterol Cal 30 5 - 40 mg/dL   LDL Chol Calc (NIH) 100 (H) 0 - 99 mg/dL   Chol/HDL Ratio 3.5 0.0 - 5.0 ratio  Hepatic function panel  Result Value Ref Range   Total Protein 6.8 6.0 - 8.5 g/dL   Albumin 4.7 3.8 - 4.9 g/dL   Bilirubin Total 1.0 0.0 - 1.2 mg/dL   Bilirubin, Direct 0.24 0.00 - 0.40 mg/dL   Alkaline Phosphatase 82 39 - 117 IU/L   AST 24 0 - 40 IU/L   ALT 37 0 - 44 IU/L     Vicente Males, LPN   Blood pressure medicine and blood pressure levels reviewed today with patient. Compliant with blood pressure medicine. States does not miss a dose. No obvious side effects. Blood pressure generally good when checked elsewhere. Watching salt intake.  Patient continues to take lipid medication regularly. No obvious side effects from it. Generally does not miss a dose. Prior blood work results are reviewed with patient. Patient continues to work on fat intake in diet    Review of Systems No headache,  no major weight loss or weight gain, no chest pain no back pain abdominal pain no change in bowel habits complete ROS otherwise negative     Objective:   Physical Exam   Virtual     Assessment & Plan:  Impression essential hypertension good control discussed compliance discussed medication refill  2.  Hyperlipidemia.  Blood work reviewed good control discussed maintain same meds diet encouraged  3.  Prediabetes clinically stable discussed  Colon cancer history.  CEA today within normal limits which is encouraging discussed  Encouraged to get a flu shot.  Follow-up as scheduled in 6 months diet exercise discussed

## 2019-08-16 ENCOUNTER — Encounter: Payer: Self-pay | Admitting: Family Medicine

## 2020-01-25 ENCOUNTER — Telehealth: Payer: Self-pay | Admitting: Family Medicine

## 2020-01-25 DIAGNOSIS — Z79899 Other long term (current) drug therapy: Secondary | ICD-10-CM

## 2020-01-25 DIAGNOSIS — Z125 Encounter for screening for malignant neoplasm of prostate: Secondary | ICD-10-CM

## 2020-01-25 DIAGNOSIS — E785 Hyperlipidemia, unspecified: Secondary | ICD-10-CM

## 2020-01-25 DIAGNOSIS — C189 Malignant neoplasm of colon, unspecified: Secondary | ICD-10-CM

## 2020-01-25 NOTE — Telephone Encounter (Signed)
Patient has 6 month follow up  On 8/18 and needing labs done

## 2020-01-25 NOTE — Telephone Encounter (Signed)
Last labs 04/13/19 lipid, liver, bmp, psa, cea

## 2020-01-26 NOTE — Addendum Note (Signed)
Addended by: Carmelina Noun on: 01/26/2020 04:37 PM   Modules accepted: Orders

## 2020-01-26 NOTE — Telephone Encounter (Signed)
Order put in but need to let pt know he has two separate orders so lab draws both. Left message to return call

## 2020-01-26 NOTE — Telephone Encounter (Signed)
Bw orders put in and pt was notified. He asked if the cea was ordered and I told him no and he requested it to be added. States he does this test once a year

## 2020-01-26 NOTE — Telephone Encounter (Signed)
Pls add cea. Thx. Dr. Lovena Le

## 2020-01-26 NOTE — Telephone Encounter (Signed)
Pls order cbc, cmp, lipids, and psa. Thx. Dr. Miyo Aina  

## 2020-01-26 NOTE — Telephone Encounter (Signed)
Left message to return call 

## 2020-01-29 NOTE — Telephone Encounter (Signed)
Pt.notified

## 2020-02-02 LAB — COMPREHENSIVE METABOLIC PANEL
ALT: 43 IU/L (ref 0–44)
AST: 25 IU/L (ref 0–40)
Albumin/Globulin Ratio: 1.8 (ref 1.2–2.2)
Albumin: 4.6 g/dL (ref 3.8–4.9)
Alkaline Phosphatase: 84 IU/L (ref 48–121)
BUN/Creatinine Ratio: 11 (ref 9–20)
BUN: 12 mg/dL (ref 6–24)
Bilirubin Total: 0.7 mg/dL (ref 0.0–1.2)
CO2: 25 mmol/L (ref 20–29)
Calcium: 9.2 mg/dL (ref 8.7–10.2)
Chloride: 100 mmol/L (ref 96–106)
Creatinine, Ser: 1.09 mg/dL (ref 0.76–1.27)
GFR calc Af Amer: 88 mL/min/{1.73_m2} (ref >59)
GFR calc non Af Amer: 77 mL/min/{1.73_m2} (ref >59)
Globulin, Total: 2.5 g/dL (ref 1.5–4.5)
Glucose: 102 mg/dL — ABNORMAL HIGH (ref 65–99)
Potassium: 4.5 mmol/L (ref 3.5–5.2)
Sodium: 141 mmol/L (ref 134–144)
Total Protein: 7.1 g/dL (ref 6.0–8.5)

## 2020-02-02 LAB — CBC WITH DIFFERENTIAL/PLATELET
Basophils Absolute: 0.1 10*3/uL (ref 0.0–0.2)
Basos: 1 %
EOS (ABSOLUTE): 0.3 10*3/uL (ref 0.0–0.4)
Eos: 4 %
Hematocrit: 42.8 % (ref 37.5–51.0)
Hemoglobin: 15.2 g/dL (ref 13.0–17.7)
Immature Grans (Abs): 0.1 10*3/uL (ref 0.0–0.1)
Immature Granulocytes: 2 %
Lymphocytes Absolute: 1.8 10*3/uL (ref 0.7–3.1)
Lymphs: 24 %
MCH: 32 pg (ref 26.6–33.0)
MCHC: 35.5 g/dL (ref 31.5–35.7)
MCV: 90 fL (ref 79–97)
Monocytes Absolute: 0.8 10*3/uL (ref 0.1–0.9)
Monocytes: 10 %
Neutrophils Absolute: 4.5 10*3/uL (ref 1.4–7.0)
Neutrophils: 59 %
Platelets: 308 10*3/uL (ref 150–450)
RBC: 4.75 x10E6/uL (ref 4.14–5.80)
RDW: 12.7 % (ref 11.6–15.4)
WBC: 7.5 10*3/uL (ref 3.4–10.8)

## 2020-02-02 LAB — PSA: Prostate Specific Ag, Serum: 1.8 ng/mL (ref 0.0–4.0)

## 2020-02-02 LAB — LIPID PANEL
Chol/HDL Ratio: 4.1 ratio (ref 0.0–5.0)
Cholesterol, Total: 193 mg/dL (ref 100–199)
HDL: 47 mg/dL (ref >39)
LDL Chol Calc (NIH): 99 mg/dL (ref 0–99)
Triglycerides: 279 mg/dL — ABNORMAL HIGH (ref 0–149)
VLDL Cholesterol Cal: 47 mg/dL — ABNORMAL HIGH (ref 5–40)

## 2020-02-02 LAB — CEA: CEA: 1.8 ng/mL (ref 0.0–4.7)

## 2020-02-28 ENCOUNTER — Ambulatory Visit: Payer: BC Managed Care – PPO | Admitting: Family Medicine

## 2020-02-28 ENCOUNTER — Encounter: Payer: Self-pay | Admitting: Family Medicine

## 2020-02-28 ENCOUNTER — Other Ambulatory Visit: Payer: Self-pay

## 2020-02-28 VITALS — BP 136/84 | HR 85 | Temp 97.8°F | Ht 67.0 in | Wt 199.6 lb

## 2020-02-28 DIAGNOSIS — R7303 Prediabetes: Secondary | ICD-10-CM

## 2020-02-28 DIAGNOSIS — I10 Essential (primary) hypertension: Secondary | ICD-10-CM

## 2020-02-28 DIAGNOSIS — S90111A Contusion of right great toe without damage to nail, initial encounter: Secondary | ICD-10-CM

## 2020-02-28 DIAGNOSIS — E781 Pure hyperglyceridemia: Secondary | ICD-10-CM | POA: Diagnosis not present

## 2020-02-28 NOTE — Progress Notes (Addendum)
Patient ID: Scott Meyers, male    DOB: [birth year], 54 y.o.   MRN: [id number]   Chief Complaint  Patient presents with  . Hypertension   Subjective:    HPI Pt seen today for f/u HTN. H/o colon cancer and had 2 polyps that were cancerous. Need to repeat in 3 yrs., due in 6/22.  Elevated TGs- Drinking beer- 6 per day. Some times more and others less.  CEA- 1.8 last time was 1.5.  Rt toe is bruised and dropped meat on it. Hurting the big toe on right. Been 5 days since the injury.  No swelling, erythema or bruising.  Able to bend toe.  Has injured it about 1 yr ago, "turf toe." and bend the toe all the way back under itself and was given prednisone. Has not taken any meds for it.   Medical History Scott Meyers has a past medical history of Cancer ([place]), Gout, Hyperlipidemia, Hyperlipidemia, Hypertension, and Impaired fasting glucose.   Outpatient Encounter Medications as of 02/28/2020  Medication Sig  . amoxicillin (AMOXIL) 500 MG tablet Take 1 tablet (500 mg total) by mouth 3 (three) times daily. (Patient not taking: Reported on 04/19/2019)  . irbesartan (AVAPRO) 300 MG tablet TAKE 1 TABLET BY MOUTH EVERY DAY *REPLACES LOSARTAN  . predniSONE (DELTASONE) 20 MG tablet Take three po for three days,the take two po for three days,then take one po for three days. (Patient not taking: Reported on 04/19/2019)  . simvastatin (ZOCOR) 20 MG tablet Take 1 tablet (20 mg total) by mouth daily at 6 PM.   No facility-administered encounter medications on file as of 02/28/2020.     Review of Systems  Constitutional: Negative for chills and fever.  HENT: Negative for congestion, rhinorrhea and sore throat.   Respiratory: Negative for cough, shortness of breath and wheezing.   Cardiovascular: Negative for chest pain and leg swelling.  Gastrointestinal: Negative for abdominal pain, diarrhea, nausea and vomiting.  Genitourinary: Negative for dysuria and frequency.  Musculoskeletal:       +rt great  toe pain  Skin: Negative for rash.  Neurological: Negative for dizziness, weakness and headaches.     Vitals BP 136/84   Pulse 85   Temp 97.8 F (36.6 C) (Oral)   Ht 5\' 7"  (1.702 m)   Wt 199 lb 9.6 oz (90.5 kg)   SpO2 99%   BMI 31.26 kg/m   Objective:   Physical Exam Vitals and nursing note reviewed.  Constitutional:      General: He is not in acute distress.    Appearance: Normal appearance. He is not ill-appearing.  HENT:     Head: Normocephalic.     Nose: Nose normal. No congestion.     Mouth/Throat:     Mouth: Mucous membranes are moist.     Pharynx: No oropharyngeal exudate.  Eyes:     Extraocular Movements: Extraocular movements intact.     Conjunctiva/sclera: Conjunctivae normal.     Pupils: Pupils are equal, round, and reactive to light.  Cardiovascular:     Rate and Rhythm: Normal rate and regular rhythm.     Pulses: Normal pulses.     Heart sounds: Normal heart sounds. No murmur heard.   Pulmonary:     Effort: Pulmonary effort is normal.     Breath sounds: Normal breath sounds. No wheezing, rhonchi or rales.  Musculoskeletal:        General: Tenderness (rt great toe at dip. no erythema, warmth, swelling, or ecchymosis) and signs  of injury (rt great toe) present. Normal range of motion.     Right lower leg: No edema.     Left lower leg: No edema.  Skin:    General: Skin is warm and dry.     Findings: No rash.  Neurological:     General: No focal deficit present.     Mental Status: He is alert and oriented to person, place, and time.     Cranial Nerves: No cranial nerve deficit.  Psychiatric:        Mood and Affect: Mood normal.        Behavior: Behavior normal.        Thought Content: Thought content normal.        Judgment: Judgment normal.      Assessment and Plan   1. Essential hypertension  2. Prediabetes  3. Contusion of right great toe without damage to nail, initial encounter  4. Hypertriglyceridemia    HTN- suboptimal, advising  to take medication in AM instead of night and check at home bp cuff and call if seeing numbers over 270-786L systolic.  Ordering psa on next visit. F/u cpe next visit.  Rt great toe pain and contusion- Pt declining xray at this time. Advising tylenol, ibuprofen and ice, rest the foot and wear flat shoe. If worsening or not improving in next 2-3 wks. Then return and can order the xray.  hypertriglyceridemia advising to cont with zocor and to decrease alcohol intake.   impaired fasting- improved. cont to watch carbs and dec alcohol intake.  F/u 38mo for cpe.

## 2020-07-09 ENCOUNTER — Telehealth: Payer: Self-pay

## 2020-07-09 DIAGNOSIS — E785 Hyperlipidemia, unspecified: Secondary | ICD-10-CM

## 2020-07-09 DIAGNOSIS — I10 Essential (primary) hypertension: Secondary | ICD-10-CM

## 2020-07-09 DIAGNOSIS — E781 Pure hyperglyceridemia: Secondary | ICD-10-CM

## 2020-07-09 DIAGNOSIS — Z79899 Other long term (current) drug therapy: Secondary | ICD-10-CM

## 2020-07-09 DIAGNOSIS — C189 Malignant neoplasm of colon, unspecified: Secondary | ICD-10-CM

## 2020-07-09 NOTE — Telephone Encounter (Signed)
Last labs completed 02/01/20 CEA, PSA, CBC, CMP, LIPID. Please advise. Thank you

## 2020-07-09 NOTE — Telephone Encounter (Signed)
Pls order cea, cbc, cmp. Lipids.   Thx. Dr. Ladona Ridgel

## 2020-07-09 NOTE — Telephone Encounter (Signed)
Lab work ordered and pt is aware

## 2020-07-09 NOTE — Telephone Encounter (Signed)
Pt has appt 01/12 need blood work ordered and also needs CEA blood work as well   Pt call back [phone]

## 2020-07-12 LAB — CBC WITH DIFFERENTIAL/PLATELET
Basophils Absolute: 0 10*3/uL (ref 0.0–0.2)
Basos: 1 %
EOS (ABSOLUTE): 0.3 10*3/uL (ref 0.0–0.4)
Eos: 4 %
Hematocrit: 44.5 % (ref 37.5–51.0)
Hemoglobin: 15.3 g/dL (ref 13.0–17.7)
Immature Grans (Abs): 0 10*3/uL (ref 0.0–0.1)
Immature Granulocytes: 1 %
Lymphocytes Absolute: 2 10*3/uL (ref 0.7–3.1)
Lymphs: 30 %
MCH: 31.3 pg (ref 26.6–33.0)
MCHC: 34.4 g/dL (ref 31.5–35.7)
MCV: 91 fL (ref 79–97)
Monocytes Absolute: 0.7 10*3/uL (ref 0.1–0.9)
Monocytes: 10 %
Neutrophils Absolute: 3.6 10*3/uL (ref 1.4–7.0)
Neutrophils: 54 %
Platelets: 318 10*3/uL (ref 150–450)
RBC: 4.89 x10E6/uL (ref 4.14–5.80)
RDW: 12.4 % (ref 11.6–15.4)
WBC: 6.5 10*3/uL (ref 3.4–10.8)

## 2020-07-12 LAB — LIPID PANEL
Chol/HDL Ratio: 3.6 ratio (ref 0.0–5.0)
Cholesterol, Total: 175 mg/dL (ref 100–199)
HDL: 49 mg/dL (ref >39)
LDL Chol Calc (NIH): 88 mg/dL (ref 0–99)
Triglycerides: 227 mg/dL — ABNORMAL HIGH (ref 0–149)
VLDL Cholesterol Cal: 38 mg/dL (ref 5–40)

## 2020-07-12 LAB — COMPREHENSIVE METABOLIC PANEL
ALT: 44 IU/L (ref 0–44)
AST: 24 IU/L (ref 0–40)
Albumin/Globulin Ratio: 2 (ref 1.2–2.2)
Albumin: 4.5 g/dL (ref 3.8–4.9)
Alkaline Phosphatase: 75 IU/L (ref 44–121)
BUN/Creatinine Ratio: 12 (ref 9–20)
BUN: 13 mg/dL (ref 6–24)
Bilirubin Total: 0.9 mg/dL (ref 0.0–1.2)
CO2: 24 mmol/L (ref 20–29)
Calcium: 9.5 mg/dL (ref 8.7–10.2)
Chloride: 101 mmol/L (ref 96–106)
Creatinine, Ser: 1.11 mg/dL (ref 0.76–1.27)
GFR calc Af Amer: 87 mL/min/{1.73_m2} (ref >59)
GFR calc non Af Amer: 75 mL/min/{1.73_m2} (ref >59)
Globulin, Total: 2.3 g/dL (ref 1.5–4.5)
Glucose: 111 mg/dL — ABNORMAL HIGH (ref 65–99)
Potassium: 4.4 mmol/L (ref 3.5–5.2)
Sodium: 140 mmol/L (ref 134–144)
Total Protein: 6.8 g/dL (ref 6.0–8.5)

## 2020-07-12 LAB — CEA: CEA: 1.3 ng/mL (ref 0.0–4.7)

## 2020-07-22 ENCOUNTER — Other Ambulatory Visit: Payer: Self-pay | Admitting: *Deleted

## 2020-07-22 MED ORDER — IRBESARTAN 300 MG PO TABS: ORAL_TABLET | ORAL | 1 refills | Status: DC

## 2020-07-24 ENCOUNTER — Telehealth: Payer: Self-pay | Admitting: Family Medicine

## 2020-07-24 ENCOUNTER — Ambulatory Visit: Payer: BC Managed Care – PPO | Admitting: Family Medicine

## 2020-07-24 NOTE — Telephone Encounter (Signed)
Looks like it was already sent. Dr. Lovena Le

## 2020-07-24 NOTE — Telephone Encounter (Signed)
Patient is needing refill on irbesartan 300 mg due to having to reschedule until 1/25 had appointment this morning. [facility]

## 2020-07-24 NOTE — Telephone Encounter (Signed)
Left message to return call 

## 2020-07-24 NOTE — Telephone Encounter (Signed)
Pt returned call and verbalized understanding  

## 2020-07-24 NOTE — Telephone Encounter (Signed)
Please advise. Thank you

## 2020-08-06 ENCOUNTER — Encounter: Payer: Self-pay | Admitting: Family Medicine

## 2020-08-06 ENCOUNTER — Other Ambulatory Visit: Payer: Self-pay

## 2020-08-06 ENCOUNTER — Ambulatory Visit: Payer: BC Managed Care – PPO | Admitting: Family Medicine

## 2020-08-06 VITALS — BP 128/76 | HR 104 | Temp 97.1°F | Wt 208.2 lb

## 2020-08-06 DIAGNOSIS — R7303 Prediabetes: Secondary | ICD-10-CM

## 2020-08-06 DIAGNOSIS — E785 Hyperlipidemia, unspecified: Secondary | ICD-10-CM | POA: Diagnosis not present

## 2020-08-06 DIAGNOSIS — I10 Essential (primary) hypertension: Secondary | ICD-10-CM

## 2020-08-06 MED ORDER — SIMVASTATIN 20 MG PO TABS: 20.0000 mg | ORAL_TABLET | Freq: Every day | ORAL | 0 refills | Status: DC

## 2020-08-06 NOTE — Progress Notes (Signed)
Patient ID: Scott Meyers, male    DOB: [birth year], 55 y.o.   MRN: [id number]   Chief Complaint  Patient presents with  . Hypertension    Patient not having any concerns at this time.    Subjective:    HPI   F/u htn- Doing well.  No new concerns on meds.  Pt used to see Dr. Laural Golden.  Has h/o colon cancer.  They check CEA yearly. Not having to see them yearly. Had colon cancer 2018. Found 2 polyps were cancerous. No chemotherapy.  Labs reviewed.   Medical History Scott Meyers has a past medical history of Cancer ([place]), Gout, Hyperlipidemia, Hyperlipidemia, Hypertension, and Impaired fasting glucose.   Outpatient Encounter Medications as of 08/06/2020  Medication Sig  . irbesartan (AVAPRO) 300 MG tablet TAKE 1 TABLET BY MOUTH EVERY DAY *REPLACES LOSARTAN  . [START ON 10/17/2020] simvastatin (ZOCOR) 20 MG tablet Take 1 tablet (20 mg total) by mouth daily at 6 PM.  . [DISCONTINUED] amoxicillin (AMOXIL) 500 MG tablet Take 1 tablet (500 mg total) by mouth 3 (three) times daily. (Patient not taking: Reported on 04/19/2019)  . [DISCONTINUED] predniSONE (DELTASONE) 20 MG tablet Take three po for three days,the take two po for three days,then take one po for three days. (Patient not taking: Reported on 04/19/2019)  . [DISCONTINUED] simvastatin (ZOCOR) 20 MG tablet Take 1 tablet (20 mg total) by mouth daily at 6 PM.   No facility-administered encounter medications on file as of 08/06/2020.     Review of Systems  Constitutional: Negative for chills and fever.  HENT: Negative for congestion, rhinorrhea and sore throat.   Respiratory: Negative for cough, shortness of breath and wheezing.   Cardiovascular: Negative for chest pain and leg swelling.  Gastrointestinal: Negative for abdominal pain, diarrhea, nausea and vomiting.  Genitourinary: Negative for dysuria and frequency.  Skin: Negative for rash.  Neurological: Negative for dizziness, weakness and headaches.     Vitals BP 128/76    Pulse (!) 104   Temp (!) 97.1 F (36.2 C)   Wt 208 lb 3.2 oz (94.4 kg)   SpO2 98%   BMI 32.61 kg/m   Objective:   Physical Exam Vitals and nursing note reviewed.  Constitutional:      General: He is not in acute distress.    Appearance: Normal appearance. He is not ill-appearing.  HENT:     Head: Normocephalic.     Nose: Nose normal. No congestion.     Mouth/Throat:     Mouth: Mucous membranes are moist.     Pharynx: No oropharyngeal exudate.  Eyes:     Extraocular Movements: Extraocular movements intact.     Conjunctiva/sclera: Conjunctivae normal.     Pupils: Pupils are equal, round, and reactive to light.  Cardiovascular:     Rate and Rhythm: Regular rhythm. Tachycardia present.     Pulses: Normal pulses.     Heart sounds: Normal heart sounds. No murmur heard.   Pulmonary:     Effort: Pulmonary effort is normal.     Breath sounds: Normal breath sounds. No wheezing, rhonchi or rales.  Musculoskeletal:        General: Normal range of motion.     Right lower leg: No edema.     Left lower leg: No edema.  Skin:    General: Skin is warm and dry.     Findings: No rash.  Neurological:     General: No focal deficit present.     Mental Status:  He is alert and oriented to person, place, and time.     Cranial Nerves: No cranial nerve deficit.  Psychiatric:        Mood and Affect: Mood normal.        Behavior: Behavior normal.        Thought Content: Thought content normal.        Judgment: Judgment normal.      Assessment and Plan   1. Essential hypertension  2. Prediabetes  3. Hyperlipidemia LDL goal <130    Pt had refills sent in.    htn- stable. Cont meds.  hld- stable. Cont to watch carbs.  Cont meds.  Impaired fasting- pt to watch carb intake and cont to monitor.  H/o colon cancer- cea is normal.  Not seeing oncology or GI at this time. Next colonoscopy due 6/22.  F/u 43mo or prn.

## 2020-08-30 ENCOUNTER — Ambulatory Visit: Payer: BC Managed Care – PPO | Admitting: Family Medicine

## 2020-11-04 ENCOUNTER — Other Ambulatory Visit: Payer: Self-pay | Admitting: Family Medicine

## 2020-11-28 ENCOUNTER — Encounter (INDEPENDENT_AMBULATORY_CARE_PROVIDER_SITE_OTHER): Payer: Self-pay | Admitting: *Deleted

## 2021-01-10 ENCOUNTER — Telehealth: Payer: BC Managed Care – PPO | Admitting: Family Medicine

## 2021-01-10 NOTE — Telephone Encounter (Signed)
Last labs completed 07/11/20 LIPID,CMP, CBC and CEA. Please advise. Thank you

## 2021-01-10 NOTE — Telephone Encounter (Signed)
Patient requesting lab orders sent to [facility]. He has an appt on 02/03/2021  CB# [phone]

## 2021-01-15 ENCOUNTER — Other Ambulatory Visit: Payer: Self-pay | Admitting: *Deleted

## 2021-01-15 DIAGNOSIS — Z85038 Personal history of other malignant neoplasm of large intestine: Secondary | ICD-10-CM

## 2021-01-15 DIAGNOSIS — Z79899 Other long term (current) drug therapy: Secondary | ICD-10-CM

## 2021-01-15 DIAGNOSIS — E785 Hyperlipidemia, unspecified: Secondary | ICD-10-CM

## 2021-01-15 NOTE — Telephone Encounter (Signed)
Called mobile number and pt was notified that lab orders are ready

## 2021-01-15 NOTE — Telephone Encounter (Signed)
Bw orders put in and number on message not working

## 2021-01-18 ENCOUNTER — Other Ambulatory Visit: Payer: Self-pay | Admitting: Family Medicine

## 2021-01-18 LAB — LIPID PANEL
Chol/HDL Ratio: 3.7 ratio (ref 0.0–5.0)
Cholesterol, Total: 204 mg/dL — ABNORMAL HIGH (ref 100–199)
HDL: 55 mg/dL (ref >39)
LDL Chol Calc (NIH): 120 mg/dL — ABNORMAL HIGH (ref 0–99)
Triglycerides: 167 mg/dL — ABNORMAL HIGH (ref 0–149)
VLDL Cholesterol Cal: 29 mg/dL (ref 5–40)

## 2021-01-18 LAB — COMPREHENSIVE METABOLIC PANEL
ALT: 48 IU/L — ABNORMAL HIGH (ref 0–44)
AST: 31 IU/L (ref 0–40)
Albumin/Globulin Ratio: 2.3 — ABNORMAL HIGH (ref 1.2–2.2)
Albumin: 4.9 g/dL (ref 3.8–4.9)
Alkaline Phosphatase: 86 IU/L (ref 44–121)
BUN/Creatinine Ratio: 16 (ref 9–20)
BUN: 20 mg/dL (ref 6–24)
Bilirubin Total: 0.9 mg/dL (ref 0.0–1.2)
CO2: 22 mmol/L (ref 20–29)
Calcium: 9.1 mg/dL (ref 8.7–10.2)
Chloride: 100 mmol/L (ref 96–106)
Creatinine, Ser: 1.27 mg/dL (ref 0.76–1.27)
Globulin, Total: 2.1 g/dL (ref 1.5–4.5)
Glucose: 114 mg/dL — ABNORMAL HIGH (ref 65–99)
Potassium: 5.1 mmol/L (ref 3.5–5.2)
Sodium: 139 mmol/L (ref 134–144)
Total Protein: 7 g/dL (ref 6.0–8.5)
eGFR: 67 mL/min/{1.73_m2} (ref >59)

## 2021-01-18 LAB — CBC WITH DIFFERENTIAL/PLATELET
Basophils Absolute: 0 10*3/uL (ref 0.0–0.2)
Basos: 1 %
EOS (ABSOLUTE): 0.2 10*3/uL (ref 0.0–0.4)
Eos: 3 %
Hematocrit: 44 % (ref 37.5–51.0)
Hemoglobin: 15.2 g/dL (ref 13.0–17.7)
Immature Grans (Abs): 0 10*3/uL (ref 0.0–0.1)
Immature Granulocytes: 0 %
Lymphocytes Absolute: 1.9 10*3/uL (ref 0.7–3.1)
Lymphs: 33 %
MCH: 31.4 pg (ref 26.6–33.0)
MCHC: 34.5 g/dL (ref 31.5–35.7)
MCV: 91 fL (ref 79–97)
Monocytes Absolute: 0.6 10*3/uL (ref 0.1–0.9)
Monocytes: 11 %
Neutrophils Absolute: 2.9 10*3/uL (ref 1.4–7.0)
Neutrophils: 52 %
Platelets: 321 10*3/uL (ref 150–450)
RBC: 4.84 x10E6/uL (ref 4.14–5.80)
RDW: 12.6 % (ref 11.6–15.4)
WBC: 5.6 10*3/uL (ref 3.4–10.8)

## 2021-01-18 LAB — CEA: CEA: 1.5 ng/mL (ref 0.0–4.7)

## 2021-02-03 ENCOUNTER — Encounter: Payer: Self-pay | Admitting: Family Medicine

## 2021-02-03 ENCOUNTER — Telehealth (INDEPENDENT_AMBULATORY_CARE_PROVIDER_SITE_OTHER): Payer: Self-pay

## 2021-02-03 ENCOUNTER — Other Ambulatory Visit: Payer: Self-pay

## 2021-02-03 ENCOUNTER — Other Ambulatory Visit (INDEPENDENT_AMBULATORY_CARE_PROVIDER_SITE_OTHER): Payer: Self-pay

## 2021-02-03 ENCOUNTER — Encounter (INDEPENDENT_AMBULATORY_CARE_PROVIDER_SITE_OTHER): Payer: Self-pay

## 2021-02-03 ENCOUNTER — Ambulatory Visit: Payer: BC Managed Care – PPO | Admitting: Family Medicine

## 2021-02-03 VITALS — BP 134/88 | HR 85 | Temp 97.3°F | Ht 67.0 in | Wt 197.0 lb

## 2021-02-03 DIAGNOSIS — I10 Essential (primary) hypertension: Secondary | ICD-10-CM | POA: Diagnosis not present

## 2021-02-03 DIAGNOSIS — Z23 Encounter for immunization: Secondary | ICD-10-CM | POA: Diagnosis not present

## 2021-02-03 DIAGNOSIS — E785 Hyperlipidemia, unspecified: Secondary | ICD-10-CM | POA: Diagnosis not present

## 2021-02-03 DIAGNOSIS — Z1211 Encounter for screening for malignant neoplasm of colon: Secondary | ICD-10-CM

## 2021-02-03 DIAGNOSIS — R7301 Impaired fasting glucose: Secondary | ICD-10-CM

## 2021-02-03 MED ORDER — PEG 3350-KCL-NA BICARB-NACL 420 G PO SOLR: 4000.0000 mL | ORAL | 0 refills | Status: DC

## 2021-02-03 MED ORDER — IRBESARTAN 300 MG PO TABS: ORAL_TABLET | ORAL | 0 refills | Status: DC

## 2021-02-03 MED ORDER — SIMVASTATIN 20 MG PO TABS: ORAL_TABLET | ORAL | 0 refills | Status: DC

## 2021-02-03 NOTE — Telephone Encounter (Signed)
LeighAnn Analina Filla, CMA  

## 2021-02-03 NOTE — Progress Notes (Signed)
Patient ID: Scott Meyers, male    DOB: 03/19/1966, 55 y.o.   MRN: [id number]   Chief Complaint  Patient presents with   Hypertension   Subjective:    HPI  Had elevated glucose- on labs.  At 114.  Slight increase from last time at 111.  HLD- doing well no new concerns.  Compliant with meds. No chest pain, palpitations, myalgias or joint pains.  Pt is on every 3 yrs colonoscopy.  Due 6/22.   HTN Pt compliant with BP meds usually.  No SEs Denies chest pain, sob, LE swelling, or blurry vision. Hasn't had bp meds yet today.  Pt requesting tdap vaccine.  Medical History Scott Meyers has a past medical history of Cancer [facility]), Colon cancer ([place]) (09/23/2016), Gout, Hyperlipidemia, Hyperlipidemia, Hypertension, and Impaired fasting glucose.   Outpatient Encounter Medications as of 02/03/2021  Medication Sig   irbesartan (AVAPRO) 300 MG tablet TAKE 1 TABLET BY MOUTH EVERY DAY *REPLACES LOSARTAN   simvastatin (ZOCOR) 20 MG tablet TAKE 1 TABLET BY MOUTH DAILY AT 6 PM.   [DISCONTINUED] irbesartan (AVAPRO) 300 MG tablet TAKE 1 TABLET BY MOUTH EVERY DAY *REPLACES LOSARTAN   [DISCONTINUED] simvastatin (ZOCOR) 20 MG tablet TAKE 1 TABLET BY MOUTH DAILY AT 6 PM.   No facility-administered encounter medications on file as of 02/03/2021.     Review of Systems  Constitutional:  Negative for chills and fever.  HENT:  Negative for congestion, rhinorrhea and sore throat.   Respiratory:  Negative for cough, shortness of breath and wheezing.   Cardiovascular:  Negative for chest pain and leg swelling.  Gastrointestinal:  Negative for abdominal pain, diarrhea, nausea and vomiting.  Genitourinary:  Negative for dysuria and frequency.  Skin:  Negative for rash.  Neurological:  Negative for dizziness, weakness and headaches.    Vitals BP 134/88   Pulse 85   Temp (!) 97.3 F (36.3 C)   Ht '5\' 7"'$  (1.702 m)   Wt 197 lb (89.4 kg)   SpO2 98%   BMI 30.85 kg/m   Objective:   Physical  Exam Vitals and nursing note reviewed.  Constitutional:      General: He is not in acute distress.    Appearance: Normal appearance. He is not ill-appearing.  HENT:     Head: Normocephalic.  Eyes:     Extraocular Movements: Extraocular movements intact.     Conjunctiva/sclera: Conjunctivae normal.     Pupils: Pupils are equal, round, and reactive to light.  Cardiovascular:     Rate and Rhythm: Normal rate and regular rhythm.     Pulses: Normal pulses.     Heart sounds: Normal heart sounds. No murmur heard. Pulmonary:     Effort: Pulmonary effort is normal.     Breath sounds: Normal breath sounds. No wheezing, rhonchi or rales.  Musculoskeletal:        General: Normal range of motion.     Right lower leg: No edema.     Left lower leg: No edema.  Skin:    General: Skin is warm and dry.     Findings: No rash.  Neurological:     General: No focal deficit present.     Mental Status: He is alert and oriented to person, place, and time.     Cranial Nerves: No cranial nerve deficit.  Psychiatric:        Mood and Affect: Mood normal.        Behavior: Behavior normal.  Thought Content: Thought content normal.        Judgment: Judgment normal.     Assessment and Plan   1. Impaired fasting glucose  2. Encounter for immunization - Tdap vaccine greater than or equal to 7yo IM  3. Essential hypertension  4. Hyperlipidemia LDL goal <130   Pt to f/u with new provider.  Labs reviewed and discussed.  Htn- suboptimal, pt stating didn't take meds yet today. Pt to take them when he get home.  Watch salt in the diet.  Hld- suboptimal. make sure to take zocor daily.  Pt has missed some doses.    Impaired fasting glucose- slightly increased, pt to dec carbs in diet and inc in exercising.  Return in about 6 months (around 08/06/2021), or if symptoms worsen or fail to improve, for f/u hld, htn.  BP Readings from Last 3 Encounters:  02/13/21 (!) 139/95  02/03/21 134/88   08/06/20 128/76

## 2021-02-03 NOTE — Telephone Encounter (Signed)
Scott Meyers, CMA  

## 2021-02-03 NOTE — Telephone Encounter (Signed)
Referring MD/PCP: Elvia Collum  Procedure: Tcs  Reason/Indication:  Screening  Has patient had this procedure before?  yes  If so, when, by whom and where? 2018   Is there a family history of colon cancer?  no  Who?  What age when diagnosed?    Is patient diabetic? If yes, Type 1 or Type 2   no      Does patient have prosthetic heart valve or mechanical valve?  no  Do you have a pacemaker/defibrillator?  no  Has patient ever had endocarditis/atrial fibrillation? no  Does patient use oxygen? no  Has patient had joint replacement within last 12 months?  no  Is patient constipated or do they take laxatives? no  Does patient have a history of alcohol/drug use?  no  Have you had a stroke/heart attack last 6 mths? no  Do you take medicine for weight loss?  no  For male patients,: do you still have your menstrual cycle? N/A  Is patient on blood thinner such as Coumadin, Plavix and/or Aspirin? no  Medications: Irbesartan 300 mg daily, Simvastatin 20 mg daily  Allergies: nkda  Medication Adjustment per Dr Laural Golden : none  Procedure date & Time: 02/13/21 at 8:20

## 2021-02-03 NOTE — Progress Notes (Signed)
   Patient ID: Scott Meyers, male    DOB: [birth year], 55 y.o.   MRN: [id number]   Chief Complaint  Patient presents with   Hypertension   Subjective:    HPI   Medical History Izaak has a past medical history of Cancer [facility]), Colon cancer ([place]) (09/23/2016), Gout, Hyperlipidemia, Hyperlipidemia, Hypertension, and Impaired fasting glucose.   Outpatient Encounter Medications as of 02/03/2021  Medication Sig   irbesartan (AVAPRO) 300 MG tablet TAKE 1 TABLET BY MOUTH EVERY DAY *REPLACES LOSARTAN   simvastatin (ZOCOR) 20 MG tablet TAKE 1 TABLET BY MOUTH DAILY AT 6 PM.   No facility-administered encounter medications on file as of 02/03/2021.     Review of Systems   Vitals BP 134/88   Pulse 85   Temp (!) 97.3 F (36.3 C)   Ht '5\' 7"'$  (1.702 m)   Wt 197 lb (89.4 kg)   SpO2 98%   BMI 30.85 kg/m   Objective:   Physical Exam   Assessment and Plan   1. Encounter for immunization      No follow-ups on file.

## 2021-02-13 ENCOUNTER — Ambulatory Visit (HOSPITAL_COMMUNITY)
Admission: RE | Admit: 2021-02-13 | Discharge: 2021-02-13 | Disposition: A | Discharge status: Home / Self Care | Payer: BC Managed Care – PPO | Attending: Internal Medicine | Admitting: Internal Medicine

## 2021-02-13 ENCOUNTER — Encounter (HOSPITAL_COMMUNITY): Payer: Self-pay | Admitting: Internal Medicine

## 2021-02-13 ENCOUNTER — Other Ambulatory Visit: Payer: Self-pay

## 2021-02-13 ENCOUNTER — Encounter: Payer: Self-pay | Admitting: Family Medicine

## 2021-02-13 ENCOUNTER — Encounter (HOSPITAL_COMMUNITY)
Admission: RE | Disposition: A | Discharge status: Home / Self Care | Payer: Self-pay | Source: Home / Self Care | Attending: Internal Medicine

## 2021-02-13 DIAGNOSIS — D124 Benign neoplasm of descending colon: Secondary | ICD-10-CM

## 2021-02-13 DIAGNOSIS — Z1211 Encounter for screening for malignant neoplasm of colon: Secondary | ICD-10-CM | POA: Diagnosis not present

## 2021-02-13 DIAGNOSIS — Z9049 Acquired absence of other specified parts of digestive tract: Secondary | ICD-10-CM | POA: Diagnosis not present

## 2021-02-13 DIAGNOSIS — Z85038 Personal history of other malignant neoplasm of large intestine: Secondary | ICD-10-CM | POA: Insufficient documentation

## 2021-02-13 DIAGNOSIS — Z79899 Other long term (current) drug therapy: Secondary | ICD-10-CM | POA: Diagnosis not present

## 2021-02-13 DIAGNOSIS — Z980 Intestinal bypass and anastomosis status: Secondary | ICD-10-CM

## 2021-02-13 DIAGNOSIS — D123 Benign neoplasm of transverse colon: Secondary | ICD-10-CM

## 2021-02-13 DIAGNOSIS — D122 Benign neoplasm of ascending colon: Secondary | ICD-10-CM | POA: Insufficient documentation

## 2021-02-13 DIAGNOSIS — Z08 Encounter for follow-up examination after completed treatment for malignant neoplasm: Secondary | ICD-10-CM

## 2021-02-13 HISTORY — PX: POLYPECTOMY: SHX5525

## 2021-02-13 HISTORY — PX: COLONOSCOPY: SHX5424

## 2021-02-13 LAB — HM COLONOSCOPY

## 2021-02-13 SURGERY — COLONOSCOPY
Anesthesia: Moderate Sedation

## 2021-02-13 MED ORDER — MIDAZOLAM HCL 5 MG/5ML IJ SOLN
INTRAMUSCULAR | Status: DC | PRN
  Administered 2021-02-13 (×5): 2 mg via INTRAVENOUS

## 2021-02-13 MED ORDER — STERILE WATER FOR IRRIGATION IR SOLN
Status: DC | PRN
  Administered 2021-02-13: 200 mL

## 2021-02-13 MED ORDER — MEPERIDINE HCL 50 MG/ML IJ SOLN
INTRAMUSCULAR | Status: AC
  Filled 2021-02-13: qty 1

## 2021-02-13 MED ORDER — MIDAZOLAM HCL 5 MG/5ML IJ SOLN
INTRAMUSCULAR | Status: AC
  Filled 2021-02-13: qty 10

## 2021-02-13 MED ORDER — SODIUM CHLORIDE 0.9 % IV SOLN: INTRAVENOUS | Status: DC

## 2021-02-13 MED ORDER — MEPERIDINE HCL 50 MG/ML IJ SOLN
INTRAMUSCULAR | Status: DC | PRN
  Administered 2021-02-13 (×2): 25 mg via INTRAVENOUS

## 2021-02-13 NOTE — Op Note (Signed)
[facility] Patient Name: Scott Meyers Procedure Date: 02/13/2021 8:38 AM MRN: [id number] Date of Birth: Nov 10, 1965 Attending MD: Hildred Laser , MD CSN: [id number] Age: 55 Admit Type: Outpatient Procedure:                Colonoscopy Indications:              High risk colon cancer surveillance: Personal                            history of colon cancer Providers:                Hildred Laser, MD, Lurline Del, RN, Aram Candela Referring MD:             Elvia Collum, DO Medicines:                Meperidine 50 mg IV, Midazolam 10 mg IV Complications:            No immediate complications. Estimated Blood Loss:     Estimated blood loss was minimal. Procedure:                Pre-Anesthesia Assessment:                           - Prior to the procedure, a History and Physical                            was performed, and patient medications and                            allergies were reviewed. The patient's tolerance of                            previous anesthesia was also reviewed. The risks                            and benefits of the procedure and the sedation                            options and risks were discussed with the patient.                            All questions were answered, and informed consent                            was obtained. Prior Anticoagulants: The patient has                            taken no previous anticoagulant or antiplatelet                            agents. ASA Grade Assessment: II - A patient with                            mild systemic disease. After reviewing the risks  and benefits, the patient was deemed in                            satisfactory condition to undergo the procedure.                           After obtaining informed consent, the colonoscope                            was passed under direct vision. Throughout the                            procedure, the patient's blood pressure, pulse,  and                            oxygen saturations were monitored continuously. The                            [id number] scope was introduced through                            the anus and advanced to the the cecum, identified                            by appendiceal orifice and ileocecal valve. The                            colonoscopy was performed without difficulty. The                            patient tolerated the procedure well. The quality                            of the bowel preparation was good. The ileocecal                            valve, appendiceal orifice, and rectum were                            photographed. Scope In: 9:14:43 AM Scope Out: 9:35:54 AM Scope Withdrawal Time: 0 hours 16 minutes 43 seconds  Total Procedure Duration: 0 hours 21 minutes 11 seconds  Findings:      The perianal and digital rectal examinations were normal.      Three sessile polyps were found in the descending colon, splenic flexure       and hepatic flexure. The polyps were small in size. These were biopsied       with a cold forceps for histology. These were biopsied with a cold       forceps for histology. The pathology specimen was placed into Bottle       Number 1.      There was evidence of a prior end-to-side colo-colonic anastomosis in       the distal sigmoid colon. This was patent and was characterized by       healthy appearing mucosa.  The rectum appeared normal.      The retroflexed view of the distal rectum and anal verge was normal and       showed no anal or rectal abnormalities. Impression:               - Three small polyps in the descending colon, at                            the splenic flexure and at the hepatic flexure.                            Biopsied.                           - Patent end-to-side colo-colonic anastomosis,                            characterized by healthy appearing mucosa.                           - The rectum is  normal. Moderate Sedation:      Moderate (conscious) sedation was administered by the endoscopy nurse       and supervised by the endoscopist. The following parameters were       monitored: oxygen saturation, heart rate, blood pressure, CO2       capnography and response to care. Total physician intraservice time was       31 minutes. Recommendation:           - Patient has a contact number available for                            emergencies. The signs and symptoms of potential                            delayed complications were discussed with the                            patient. Return to normal activities tomorrow.                            Written discharge instructions were provided to the                            patient.                           - Resume previous diet today.                           - Continue present medications.                           - No aspirin, ibuprofen, naproxen, or other                            non-steroidal anti-inflammatory drugs for 1 day.                           -  Await pathology results.                           - Repeat colonoscopy in 5 years for surveillance. Procedure Code(s):        --- Professional ---                           [phone], Colonoscopy, flexible; with biopsy, single                            or multiple                           99153, Moderate sedation; each additional 15                            minutes intraservice time                           G0500, Moderate sedation services provided by the                            same physician or other qualified health care                            professional performing a gastrointestinal                            endoscopic service that sedation supports,                            requiring the presence of an independent trained                            observer to assist in the monitoring of the                            patient's level of consciousness  and physiological                            status; initial 15 minutes of intra-service time;                            patient age 40 years or older (additional time may                            be reported with [phone], as appropriate) Diagnosis Code(s):        --- Professional ---                           [phone], Personal history of other malignant                            neoplasm of large intestine  K63.5, Polyp of colon                           Z98.0, Intestinal bypass and anastomosis status CPT copyright 2019 American Medical Association. All rights reserved. The codes documented in this report are preliminary and upon coder review may  be revised to meet current compliance requirements. Hildred Laser, MD Hildred Laser, MD 02/13/2021 9:49:32 AM This report has been signed electronically. Number of Addenda: 0

## 2021-02-13 NOTE — H&P (Signed)
Scott Meyers is an 55 y.o. male.   Chief Complaint: Patient is here for colonoscopy. HPI: Patient is 55 year old Caucasian male who underwent screening colonoscopy in March 2018 with removal of large polyp from sigmoid colon which had invasive carcinoma in it.  He underwent partial colectomy.  It was no residual disease and 12 out of 12 lymph nodes were negative.  He had his last colonoscopy in June 2019 and no polyps were found.  He is returning for surveillance colonoscopy.  He has no GI complaints.  He denies abdominal pain change in bowel habits or rectal bleeding. Family history is negative for CRC.  Past Medical History:  Diagnosis Date   Cancer [facility])    Colon cancer   Colon cancer ([place]) 09/23/2016   Gout    Hyperlipidemia    Hyperlipidemia    Hypertension    Impaired fasting glucose     Past Surgical History:  Procedure Laterality Date   APPENDECTOMY     COLONOSCOPY N/A 09/10/2016   Procedure: COLONOSCOPY;  Surgeon: Rogene Houston, MD;  [facility];  Service: Endoscopy;  Laterality: N/A;  830   COLONOSCOPY N/A 12/16/2017   Procedure: COLONOSCOPY;  Surgeon: Rogene Houston, MD;  [facility];  Service: Endoscopy;  Laterality: N/A;  915   PARTIAL COLECTOMY N/A 09/23/2016   Procedure: PARTIAL COLECTOMY;  Surgeon: Aviva Signs, MD;  Location: [facility];  Service: General;  Laterality: N/A;   POLYPECTOMY  09/10/2016   Procedure: POLYPECTOMY;  Surgeon: Rogene Houston, MD;  [facility];  Service: Endoscopy;;  colon    Family History  Problem Relation Age of Onset   Cancer Mother    Hypertension Father    Cancer Father    Colon cancer Neg Hx    Social History:  reports that he has never smoked. His smokeless tobacco use includes snuff. He reports current alcohol use of about 56.0 standard drinks of alcohol per week. He reports that he does not use drugs.  Allergies: No Known Allergies  Medications Prior to Admission  Medication Sig Dispense Refill    irbesartan (AVAPRO) 300 MG tablet TAKE 1 TABLET BY MOUTH EVERY DAY *REPLACES LOSARTAN (Patient taking differently: Take 300 mg by mouth daily. TAKE 1 TABLET BY MOUTH EVERY DAY *REPLACES LOSARTAN) 90 tablet 0   simvastatin (ZOCOR) 20 MG tablet TAKE 1 TABLET BY MOUTH DAILY AT 6 PM. (Patient taking differently: Take 20 mg by mouth daily.) 90 tablet 0   polyethylene glycol-electrolytes (TRILYTE) 420 g solution Take 4,000 mLs by mouth as directed. 4000 mL 0    No results found for this or any previous visit (from the past 48 hour(s)). No results found.  Review of Systems  Blood pressure (!) 161/99, temperature 97.9 F (36.6 C), temperature source Oral, resp. rate 18, height '5\' 7"'$  (1.702 m), weight 85.7 kg, SpO2 96 %. Physical Exam HENT:     Mouth/Throat:     Mouth: Mucous membranes are moist.     Pharynx: Oropharynx is clear.  Eyes:     General: No scleral icterus.    Conjunctiva/sclera: Conjunctivae normal.  Cardiovascular:     Rate and Rhythm: Normal rate and regular rhythm.     Heart sounds: Normal heart sounds. No murmur heard. Pulmonary:     Effort: Pulmonary effort is normal.     Breath sounds: Normal breath sounds.  Abdominal:     Comments: He has low midline scar.  Abdomen is soft and  nontender with organomegaly or masses.  Musculoskeletal:        General: No swelling.     Cervical back: Neck supple.  Lymphadenopathy:     Cervical: No cervical adenopathy.  Skin:    General: Skin is warm and dry.  Neurological:     Mental Status: He is alert.     Assessment/Plan  History of colon cancer. Surveillance colonoscopy.  Hildred Laser, MD 02/13/2021, 8:56 AM

## 2021-02-13 NOTE — Discharge Instructions (Signed)
No aspirin or NSAIDs for 24 hours Resume usual medications and diet as before. No driving for 24 hours. Physician will call with biopsy results. Next colonoscopy in 5 years.

## 2021-02-14 LAB — SURGICAL PATHOLOGY

## 2021-02-18 ENCOUNTER — Encounter (INDEPENDENT_AMBULATORY_CARE_PROVIDER_SITE_OTHER): Payer: Self-pay | Admitting: *Deleted

## 2021-02-21 ENCOUNTER — Encounter (HOSPITAL_COMMUNITY): Payer: Self-pay | Admitting: Internal Medicine

## 2021-05-04 ENCOUNTER — Other Ambulatory Visit: Payer: Self-pay | Admitting: Family Medicine

## 2021-06-28 ENCOUNTER — Other Ambulatory Visit: Payer: Self-pay | Admitting: Family Medicine

## 2021-07-18 ENCOUNTER — Telehealth: Payer: Self-pay | Admitting: Family Medicine

## 2021-07-18 DIAGNOSIS — R7303 Prediabetes: Secondary | ICD-10-CM

## 2021-07-18 DIAGNOSIS — E785 Hyperlipidemia, unspecified: Secondary | ICD-10-CM

## 2021-07-18 DIAGNOSIS — Z79899 Other long term (current) drug therapy: Secondary | ICD-10-CM

## 2021-07-18 NOTE — Telephone Encounter (Signed)
Last labs 01/17/21: CEA, CBC, CMP, Lipid

## 2021-07-18 NOTE — Telephone Encounter (Signed)
Pt has physical with Dr Lacinda Axon on 07/31/2021. Please send lab orders.   [phone]

## 2021-07-21 NOTE — Telephone Encounter (Signed)
Please order CBC, CMP, Lipid.  Thank you   Dr. Lacinda Axon

## 2021-07-21 NOTE — Telephone Encounter (Signed)
Blood work ordered in Epic. Left message to return call to notify patient. 

## 2021-07-22 NOTE — Telephone Encounter (Signed)
Wife Pam(DPR) notified

## 2021-07-25 LAB — COMPREHENSIVE METABOLIC PANEL
ALT: 45 IU/L — ABNORMAL HIGH (ref 0–44)
AST: 24 IU/L (ref 0–40)
Albumin/Globulin Ratio: 2.1 (ref 1.2–2.2)
Albumin: 4.6 g/dL (ref 3.8–4.9)
Alkaline Phosphatase: 74 IU/L (ref 44–121)
BUN/Creatinine Ratio: 11 (ref 9–20)
BUN: 12 mg/dL (ref 6–24)
Bilirubin Total: 0.8 mg/dL (ref 0.0–1.2)
CO2: 24 mmol/L (ref 20–29)
Calcium: 9.4 mg/dL (ref 8.7–10.2)
Chloride: 104 mmol/L (ref 96–106)
Creatinine, Ser: 1.07 mg/dL (ref 0.76–1.27)
Globulin, Total: 2.2 g/dL (ref 1.5–4.5)
Glucose: 102 mg/dL — ABNORMAL HIGH (ref 70–99)
Potassium: 4.6 mmol/L (ref 3.5–5.2)
Sodium: 140 mmol/L (ref 134–144)
Total Protein: 6.8 g/dL (ref 6.0–8.5)
eGFR: 82 mL/min/{1.73_m2} (ref >59)

## 2021-07-25 LAB — CBC WITH DIFFERENTIAL/PLATELET
Basophils Absolute: 0 10*3/uL (ref 0.0–0.2)
Basos: 1 %
EOS (ABSOLUTE): 0.2 10*3/uL (ref 0.0–0.4)
Eos: 4 %
Hematocrit: 42.2 % (ref 37.5–51.0)
Hemoglobin: 14.5 g/dL (ref 13.0–17.7)
Immature Grans (Abs): 0 10*3/uL (ref 0.0–0.1)
Immature Granulocytes: 1 %
Lymphocytes Absolute: 1.6 10*3/uL (ref 0.7–3.1)
Lymphs: 32 %
MCH: 30.7 pg (ref 26.6–33.0)
MCHC: 34.4 g/dL (ref 31.5–35.7)
MCV: 89 fL (ref 79–97)
Monocytes Absolute: 0.5 10*3/uL (ref 0.1–0.9)
Monocytes: 10 %
Neutrophils Absolute: 2.6 10*3/uL (ref 1.4–7.0)
Neutrophils: 52 %
Platelets: 274 10*3/uL (ref 150–450)
RBC: 4.73 x10E6/uL (ref 4.14–5.80)
RDW: 12.3 % (ref 11.6–15.4)
WBC: 4.9 10*3/uL (ref 3.4–10.8)

## 2021-07-25 LAB — LIPID PANEL
Chol/HDL Ratio: 4.3 ratio (ref 0.0–5.0)
Cholesterol, Total: 187 mg/dL (ref 100–199)
HDL: 44 mg/dL (ref >39)
LDL Chol Calc (NIH): 96 mg/dL (ref 0–99)
Triglycerides: 283 mg/dL — ABNORMAL HIGH (ref 0–149)
VLDL Cholesterol Cal: 47 mg/dL — ABNORMAL HIGH (ref 5–40)

## 2021-07-31 ENCOUNTER — Encounter: Payer: Self-pay | Admitting: Family Medicine

## 2021-07-31 ENCOUNTER — Other Ambulatory Visit: Payer: Self-pay

## 2021-07-31 ENCOUNTER — Ambulatory Visit (INDEPENDENT_AMBULATORY_CARE_PROVIDER_SITE_OTHER): Payer: BC Managed Care – PPO | Admitting: Family Medicine

## 2021-07-31 VITALS — BP 132/90 | HR 90 | Temp 98.6°F | Ht 66.75 in | Wt 204.2 lb

## 2021-07-31 DIAGNOSIS — E782 Mixed hyperlipidemia: Secondary | ICD-10-CM | POA: Diagnosis not present

## 2021-07-31 DIAGNOSIS — Z8739 Personal history of other diseases of the musculoskeletal system and connective tissue: Secondary | ICD-10-CM

## 2021-07-31 DIAGNOSIS — Z0001 Encounter for general adult medical examination with abnormal findings: Secondary | ICD-10-CM | POA: Diagnosis not present

## 2021-07-31 DIAGNOSIS — Z0000 Encounter for general adult medical examination without abnormal findings: Secondary | ICD-10-CM | POA: Insufficient documentation

## 2021-07-31 MED ORDER — PREDNISONE 10 MG PO TABS: ORAL_TABLET | ORAL | 0 refills | Status: DC

## 2021-07-31 MED ORDER — IRBESARTAN 300 MG PO TABS: 300.0000 mg | ORAL_TABLET | Freq: Every day | ORAL | 3 refills | Status: DC

## 2021-07-31 MED ORDER — ROSUVASTATIN CALCIUM 10 MG PO TABS: 10.0000 mg | ORAL_TABLET | Freq: Every day | ORAL | 3 refills | Status: DC

## 2021-07-31 NOTE — Progress Notes (Signed)
Subjective:  Patient ID: Scott Meyers, male    DOB: 01/03/1966  Age: 56 y.o. MRN: [id number]  CC: Annual physical  HPI Scott Meyers is a 56 y.o. male with a past medical history of colon cancer, hypertension, hyperlipidemia, gout presents for an annual physical exam.  Patient states that he is doing well.  He does report that he is having some discomfort in his great toes.  He is concerned that a gout flare may be coming on.  No current swelling or erythema.  Recent labs reviewed.  Lipid panel revealed hypertriglyceridemia.  Discussed the need to watch his diet.  He is currently on Zocor.  We will discuss change in therapy today.  Preventative Healthcare Colonoscopy: Up to date. Done 02/13/21. Needs repeat in 5 years. Immunizations Tetanus - Up to date. Pneumococcal - Not indicated per current guidelines. Flu - Declines. Shingles - Declines. Prostate cancer screening: Up to date.  Hepatitis C screening - Declines. Labs: Recent labs done and reviewed today. Alcohol use: Yes. Smoking/tobacco use: Snuff. STD/HIV testing: Declines.  PMH, Surgical Hx, Family Hx, Social History reviewed and updated as below.  Past Medical History:  Diagnosis Date   Colon cancer ([place]) 09/23/2016   Gout    Hyperlipidemia    Hypertension    Past Surgical History:  Procedure Laterality Date   APPENDECTOMY     COLONOSCOPY N/A 09/10/2016   Procedure: COLONOSCOPY;  Surgeon: Rogene Houston, MD;  [facility];  Service: Endoscopy;  Laterality: N/A;  830   COLONOSCOPY N/A 12/16/2017   Procedure: COLONOSCOPY;  Surgeon: Rogene Houston, MD;  [facility];  Service: Endoscopy;  Laterality: N/A;  915   COLONOSCOPY N/A 02/13/2021   Procedure: COLONOSCOPY;  Surgeon: Rogene Houston, MD;  [facility];  Service: Endoscopy;  Laterality: N/A;  8:20   PARTIAL COLECTOMY N/A 09/23/2016   Procedure: PARTIAL COLECTOMY;  Surgeon: Aviva Signs, MD;  Location: [facility];  Service: General;   Laterality: N/A;   POLYPECTOMY  09/10/2016   Procedure: POLYPECTOMY;  Surgeon: Rogene Houston, MD;  [facility];  Service: Endoscopy;;  colon   POLYPECTOMY  02/13/2021   Procedure: POLYPECTOMY;  Surgeon: Rogene Houston, MD;  [facility];  Service: Endoscopy;;  ascending colon   Family History  Problem Relation Age of Onset   Cancer Mother    Hypertension Father    Cancer Father    Colon cancer Neg Hx    Social History   Tobacco Use   Smoking status: Never   Smokeless tobacco: Current    Types: Snuff  Substance Use Topics   Alcohol use: Yes    Alcohol/week: 56.0 standard drinks    Types: 56 Cans of beer per week    Comment: 6 pk daily    Review of Systems  Constitutional: Negative.   Respiratory: Negative.    Cardiovascular: Negative.   Gastrointestinal: Negative.   Musculoskeletal:        Shoulder pain, left elbow popping/clicking.   Objective:   Today's Vitals: BP 132/90    Pulse 90    Temp 98.6 F (37 C) (Oral)    Ht 5' 6.75" (1.695 m)    Wt 204 lb 3.2 oz (92.6 kg)    SpO2 96%    BMI 32.22 kg/m   Physical Exam Vitals and nursing note reviewed.  Constitutional:      General: He is not in acute distress.  Appearance: Normal appearance. He is not ill-appearing.  HENT:     Head: Normocephalic and atraumatic.     Mouth/Throat:     Pharynx: Oropharynx is clear.  Eyes:     General:        Right eye: No discharge.        Left eye: No discharge.     Conjunctiva/sclera: Conjunctivae normal.  Cardiovascular:     Rate and Rhythm: Normal rate and regular rhythm.     Heart sounds: No murmur heard. Pulmonary:     Effort: Pulmonary effort is normal.     Breath sounds: Normal breath sounds. No wheezing, rhonchi or rales.  Abdominal:     General: There is no distension.     Palpations: Abdomen is soft.     Tenderness: There is no abdominal tenderness.  Musculoskeletal:        General: No swelling or tenderness. Normal range of motion.   Skin:    General: Skin is warm.     Findings: No rash.  Neurological:     General: No focal deficit present.     Mental Status: He is alert.  Psychiatric:        Mood and Affect: Mood normal.        Behavior: Behavior normal.     Assessment & Plan:   Problem List Items Addressed This Visit       Other   Hyperlipidemia    Triglycerides significantly elevated.  Uncontrolled, worsening. Switching simvastatin to Crestor.  Advised to watch diet.      Relevant Medications   irbesartan (AVAPRO) 300 MG tablet   rosuvastatin (CRESTOR) 10 MG tablet   Annual physical exam - Primary    Colonoscopy is up-to-date.  Needs repeat in 5 years. Declines HIV screening, hepatitis C screening.  Declines flu vaccine.  Declines shingles vaccine. Has had recent labs.  Reviewed today. Remainder of preventative health care is up-to-date.      History of gout    Patient believes that he is getting ready to have a gout flare.  Prednisone to be used if needed.       Meds ordered this encounter  Medications   irbesartan (AVAPRO) 300 MG tablet    Sig: Take 1 tablet (300 mg total) by mouth daily.    Dispense:  90 tablet    Refill:  3   rosuvastatin (CRESTOR) 10 MG tablet    Sig: Take 1 tablet (10 mg total) by mouth daily.    Dispense:  90 tablet    Refill:  3   predniSONE (DELTASONE) 10 MG tablet    Sig: 50 mg daily x 2 days, then 40 mg daily x 2 days, then 30 mg daily x 2 days, then 20 mg daily x 2 days, then 10 mg daily x 2 days.    Dispense:  30 tablet    Refill:  0    Follow-up: Return in about 6 months (around 01/28/2022).  [place]

## 2021-07-31 NOTE — Assessment & Plan Note (Signed)
Colonoscopy is up-to-date.  Needs repeat in 5 years. Declines HIV screening, hepatitis C screening.  Declines flu vaccine.  Declines shingles vaccine. Has had recent labs.  Reviewed today. Remainder of preventative health care is up-to-date.

## 2021-07-31 NOTE — Progress Notes (Signed)
° °  Subjective:    Patient ID: Scott Meyers, male    DOB: 1966/01/03, 56 y.o.   MRN: [id number]  HPI  The patient comes in today for a wellness visit.    A review of their health history was completed.  A review of medications was also completed.  Any needed refills; Yes on both medication  Eating habits: pretty good  Falls/  MVA accidents in past few months: No  Regular exercise: Walks  Specialist pt sees on regular basis: No  Preventative health issues were discussed.   Additional concerns: "turf toe" or gout, would like to get medication for this.   Review of Systems     Objective:   Physical Exam        Assessment & Plan:

## 2021-07-31 NOTE — Assessment & Plan Note (Signed)
Patient believes that he is getting ready to have a gout flare.  Prednisone to be used if needed.

## 2021-07-31 NOTE — Assessment & Plan Note (Addendum)
Triglycerides significantly elevated.  Uncontrolled, worsening. Switching simvastatin to Crestor.  Advised to watch diet.

## 2021-07-31 NOTE — Patient Instructions (Signed)
Use the prednisone if needed for gout.  Continue your BP medication.  Stop simvastatin and start rosuvastatin.  Follow up in 6 months.

## 2021-12-30 ENCOUNTER — Telehealth: Payer: Self-pay

## 2021-12-30 ENCOUNTER — Other Ambulatory Visit: Payer: Self-pay | Admitting: *Deleted

## 2021-12-30 DIAGNOSIS — Z125 Encounter for screening for malignant neoplasm of prostate: Secondary | ICD-10-CM

## 2021-12-30 DIAGNOSIS — E782 Mixed hyperlipidemia: Secondary | ICD-10-CM

## 2021-12-30 DIAGNOSIS — Z79899 Other long term (current) drug therapy: Secondary | ICD-10-CM

## 2021-12-30 DIAGNOSIS — I10 Essential (primary) hypertension: Secondary | ICD-10-CM

## 2021-12-30 NOTE — Telephone Encounter (Signed)
Caller name:Coreyon Hassell Done   On DPR? :Yes  Call back [place]  Provider they see: Lacinda Axon   Reason for call:Pt called' \\wanted'$  to know if blood work going be ordered before his appt July

## 2021-12-30 NOTE — Telephone Encounter (Signed)
Patient's last labs were drawn on 07/24/21 lipid,CMET and CBC. Which labs would you like to be drawn before his appt?

## 2021-12-30 NOTE — Telephone Encounter (Signed)
Patient's wife Pam informed that labs have been ordered.

## 2022-01-22 LAB — CBC WITH DIFFERENTIAL/PLATELET
Basophils Absolute: 0 10*3/uL (ref 0.0–0.2)
Basos: 1 %
EOS (ABSOLUTE): 0.3 10*3/uL (ref 0.0–0.4)
Eos: 5 %
Hematocrit: 42.5 % (ref 37.5–51.0)
Hemoglobin: 14.5 g/dL (ref 13.0–17.7)
Immature Grans (Abs): 0 10*3/uL (ref 0.0–0.1)
Immature Granulocytes: 1 %
Lymphocytes Absolute: 1.7 10*3/uL (ref 0.7–3.1)
Lymphs: 29 %
MCH: 31.1 pg (ref 26.6–33.0)
MCHC: 34.1 g/dL (ref 31.5–35.7)
MCV: 91 fL (ref 79–97)
Monocytes Absolute: 0.5 10*3/uL (ref 0.1–0.9)
Monocytes: 9 %
Neutrophils Absolute: 3.3 10*3/uL (ref 1.4–7.0)
Neutrophils: 55 %
Platelets: 292 10*3/uL (ref 150–450)
RBC: 4.66 x10E6/uL (ref 4.14–5.80)
RDW: 12.7 % (ref 11.6–15.4)
WBC: 5.9 10*3/uL (ref 3.4–10.8)

## 2022-01-22 LAB — LIPID PANEL
Chol/HDL Ratio: 3.1 ratio (ref 0.0–5.0)
Cholesterol, Total: 157 mg/dL (ref 100–199)
HDL: 51 mg/dL (ref >39)
LDL Chol Calc (NIH): 82 mg/dL (ref 0–99)
Triglycerides: 135 mg/dL (ref 0–149)
VLDL Cholesterol Cal: 24 mg/dL (ref 5–40)

## 2022-01-22 LAB — PSA: Prostate Specific Ag, Serum: 1.3 ng/mL (ref 0.0–4.0)

## 2022-01-22 LAB — CMP14+EGFR
ALT: 43 IU/L (ref 0–44)
AST: 25 IU/L (ref 0–40)
Albumin/Globulin Ratio: 2 (ref 1.2–2.2)
Albumin: 4.5 g/dL (ref 3.8–4.9)
Alkaline Phosphatase: 74 IU/L (ref 44–121)
BUN/Creatinine Ratio: 13 (ref 9–20)
BUN: 14 mg/dL (ref 6–24)
Bilirubin Total: 0.6 mg/dL (ref 0.0–1.2)
CO2: 22 mmol/L (ref 20–29)
Calcium: 9.2 mg/dL (ref 8.7–10.2)
Chloride: 101 mmol/L (ref 96–106)
Creatinine, Ser: 1.1 mg/dL (ref 0.76–1.27)
Globulin, Total: 2.2 g/dL (ref 1.5–4.5)
Glucose: 102 mg/dL — ABNORMAL HIGH (ref 70–99)
Potassium: 4.8 mmol/L (ref 3.5–5.2)
Sodium: 138 mmol/L (ref 134–144)
Total Protein: 6.7 g/dL (ref 6.0–8.5)
eGFR: 79 mL/min/{1.73_m2} (ref >59)

## 2022-01-28 ENCOUNTER — Encounter: Payer: Self-pay | Admitting: Family Medicine

## 2022-01-28 ENCOUNTER — Ambulatory Visit: Payer: BC Managed Care – PPO | Admitting: Family Medicine

## 2022-01-28 VITALS — BP 127/82 | HR 77 | Temp 98.5°F | Wt 201.6 lb

## 2022-01-28 DIAGNOSIS — I10 Essential (primary) hypertension: Secondary | ICD-10-CM | POA: Diagnosis not present

## 2022-01-28 DIAGNOSIS — Z85038 Personal history of other malignant neoplasm of large intestine: Secondary | ICD-10-CM | POA: Diagnosis not present

## 2022-01-28 DIAGNOSIS — E782 Mixed hyperlipidemia: Secondary | ICD-10-CM

## 2022-01-28 MED ORDER — ROSUVASTATIN CALCIUM 10 MG PO TABS: 10.0000 mg | ORAL_TABLET | Freq: Every day | ORAL | 3 refills | Status: DC

## 2022-01-28 MED ORDER — IRBESARTAN 300 MG PO TABS: 300.0000 mg | ORAL_TABLET | Freq: Every day | ORAL | 3 refills | Status: DC

## 2022-01-28 NOTE — Assessment & Plan Note (Signed)
Stable.  Continue irbesartan.

## 2022-01-28 NOTE — Assessment & Plan Note (Signed)
CEA ordered.  Patient can do this whenever he desires.

## 2022-01-28 NOTE — Assessment & Plan Note (Signed)
Well controlled. Continue Crestor. 

## 2022-01-28 NOTE — Progress Notes (Signed)
Subjective:  Patient ID: Scott Meyers, male    DOB: 25-Apr-1966  Age: 56 y.o. MRN: [id number]  CC: Chief Complaint  Patient presents with   Hyperlipidemia    Pt arrives to follow up on cholesterol. Pt had recent labs    HPI:  56 year old male with hypertension, prediabetes, hyperlipidemia, history of gout, history of colon cancer status post partial colectomy in 2018 presents for follow-up.  Patient is doing well.  He reports some ongoing left elbow discomfort.  This appears to be consistent with epicondylitis.  Likely due to overuse.  He is trying to rest his elbow but he is very active.  Patient's labs were recently done and were quite good.  LDL 82.  Normal PSA.  Patient has a history of colon cancer.  CEA was not obtained as I do not typically order this study.  He would like this to be done to stay on top of his cancer.  Hypertension stable on irbesartan.  Patient Active Problem List   Diagnosis Date Noted   History of gout 07/31/2021   History of colon cancer 10/07/2017   Prediabetes 11/27/2016   Essential hypertension 11/27/2016   Hyperlipidemia 10/17/2012    Social Hx   Social History   Socioeconomic History   Marital status: Married    Spouse name: Not on file   Number of children: Not on file   Years of education: Not on file   Highest education level: Not on file  Occupational History   Not on file  Tobacco Use   Smoking status: Never   Smokeless tobacco: Current    Types: Snuff  Vaping Use   Vaping Use: Never used  Substance and Sexual Activity   Alcohol use: Yes    Alcohol/week: 56.0 standard drinks of alcohol    Types: 56 Cans of beer per week    Comment: 6 pk daily   Drug use: No   Sexual activity: Not on file  Other Topics Concern   Not on file  Social History Narrative   Not on file   Social Determinants of Health   Financial Resource Strain: Not on file  Food Insecurity: Not on file  Transportation Needs: Not on file  Physical  Activity: Not on file  Stress: Not on file  Social Connections: Not on file    Review of Systems  Constitutional: Negative.   Musculoskeletal:        Left elbow pain.   Objective:  BP 127/82   Pulse 77   Temp 98.5 F (36.9 C)   Wt 201 lb 9.6 oz (91.4 kg)   SpO2 96%   BMI 31.81 kg/m      01/28/2022    8:22 AM 07/31/2021    8:51 AM 02/13/2021    9:44 AM  BP/Weight  Systolic BP 944 967 591  Diastolic BP 82 90 95  Wt. (Lbs) 201.6 204.2   BMI 31.81 kg/m2 32.22 kg/m2     Physical Exam Vitals and nursing note reviewed.  Constitutional:      General: He is not in acute distress.    Appearance: Normal appearance. He is not ill-appearing.  HENT:     Head: Normocephalic and atraumatic.  Eyes:     General:        Right eye: No discharge.        Left eye: No discharge.     Conjunctiva/sclera: Conjunctivae normal.  Cardiovascular:     Rate and Rhythm: Normal rate and regular rhythm.  Pulmonary:     Effort: Pulmonary effort is normal.     Breath sounds: Normal breath sounds. No wheezing, rhonchi or rales.  Abdominal:     General: There is no distension.     Palpations: Abdomen is soft.     Tenderness: There is no abdominal tenderness.  Neurological:     Mental Status: He is alert.  Psychiatric:        Mood and Affect: Mood normal.        Behavior: Behavior normal.    Lab Results  Component Value Date   WBC 5.9 01/21/2022   HGB 14.5 01/21/2022   HCT 42.5 01/21/2022   PLT 292 01/21/2022   GLUCOSE 102 (H) 01/21/2022   CHOL 157 01/21/2022   TRIG 135 01/21/2022   HDL 51 01/21/2022   LDLCALC 82 01/21/2022   ALT 43 01/21/2022   AST 25 01/21/2022   NA 138 01/21/2022   K 4.8 01/21/2022   CL 101 01/21/2022   CREATININE 1.10 01/21/2022   BUN 14 01/21/2022   CO2 22 01/21/2022   PSA 1.11 05/12/2014   HGBA1C 5.6 11/12/2014     Assessment & Plan:   Problem List Items Addressed This Visit       Cardiovascular and Mediastinum   Essential hypertension    Stable.   Continue irbesartan.      Relevant Medications   irbesartan (AVAPRO) 300 MG tablet   rosuvastatin (CRESTOR) 10 MG tablet     Other   Hyperlipidemia    Well-controlled.  Continue Crestor.      Relevant Medications   irbesartan (AVAPRO) 300 MG tablet   rosuvastatin (CRESTOR) 10 MG tablet   History of colon cancer - Primary    CEA ordered.  Patient can do this whenever he desires.      Relevant Orders   CEA    Meds ordered this encounter  Medications   irbesartan (AVAPRO) 300 MG tablet    Sig: Take 1 tablet (300 mg total) by mouth daily.    Dispense:  90 tablet    Refill:  3   rosuvastatin (CRESTOR) 10 MG tablet    Sig: Take 1 tablet (10 mg total) by mouth daily.    Dispense:  90 tablet    Refill:  3    Follow-up:  6 months to 1 year  [place]

## 2022-01-28 NOTE — Patient Instructions (Signed)
Continue your medications.  Follow up in December. I have already ordered the CEA. You can do it anytime.  Take care  Dr. Lacinda Axon

## 2022-01-29 LAB — CEA: CEA: 1.6 ng/mL (ref 0.0–4.7)

## 2022-03-02 ENCOUNTER — Other Ambulatory Visit: Payer: Self-pay | Admitting: Family Medicine

## 2022-03-02 ENCOUNTER — Telehealth: Payer: Self-pay | Admitting: *Deleted

## 2022-03-02 MED ORDER — PREDNISONE 10 MG PO TABS: ORAL_TABLET | ORAL | 0 refills | Status: DC

## 2022-03-02 NOTE — Telephone Encounter (Signed)
Telephone call-voicemail not set up ?

## 2022-03-02 NOTE — Telephone Encounter (Signed)
Patient states he has hx of gout in his foot- last treated in January and is having a flare and would like rx for prednisone sent to [facility]

## 2022-03-02 NOTE — Telephone Encounter (Signed)
Cook, Jayce G, DO   ? ?Rx sent.   ? ?

## 2022-03-03 NOTE — Telephone Encounter (Signed)
Patient notified

## 2022-03-27 ENCOUNTER — Telehealth: Payer: Self-pay | Admitting: *Deleted

## 2022-03-27 ENCOUNTER — Other Ambulatory Visit: Payer: Self-pay | Admitting: Family Medicine

## 2022-03-27 MED ORDER — PREDNISONE 10 MG PO TABS: ORAL_TABLET | ORAL | 0 refills | Status: DC

## 2022-03-27 NOTE — Telephone Encounter (Signed)
Left message to return call 

## 2022-03-27 NOTE — Telephone Encounter (Signed)
Patient called with flare of gout in right foot and patient is requesting script for prednisone sent to [facility]

## 2022-03-27 NOTE — Telephone Encounter (Signed)
Pt returned call

## 2022-03-27 NOTE — Telephone Encounter (Signed)
Received: Today Coral Spikes, DO   He was just treated at the end of last month. I will send in once more but he should not be having this frequent flares (will need visit in the future with Uric acid level).

## 2022-10-21 ENCOUNTER — Telehealth: Payer: Self-pay | Admitting: Family Medicine

## 2022-10-21 NOTE — Telephone Encounter (Signed)
Patient has appointment 6/11 for follow up and needing labs done

## 2022-10-23 NOTE — Telephone Encounter (Signed)
Patient has appointment 6/11 for follow up and needing labs done , most recent labs 01/2022 CEA, psa, lip, cmp, cbc.

## 2022-10-25 ENCOUNTER — Other Ambulatory Visit: Payer: Self-pay | Admitting: Family Medicine

## 2022-10-27 NOTE — Telephone Encounter (Signed)
Spoke with patient and yes he would like a CEA drawn along with his other labs .

## 2022-10-28 ENCOUNTER — Other Ambulatory Visit: Payer: Self-pay

## 2022-10-28 DIAGNOSIS — I10 Essential (primary) hypertension: Secondary | ICD-10-CM

## 2022-10-28 DIAGNOSIS — Z85038 Personal history of other malignant neoplasm of large intestine: Secondary | ICD-10-CM

## 2022-10-28 DIAGNOSIS — Z125 Encounter for screening for malignant neoplasm of prostate: Secondary | ICD-10-CM

## 2022-10-28 DIAGNOSIS — E782 Mixed hyperlipidemia: Secondary | ICD-10-CM

## 2022-12-15 LAB — CBC WITH DIFFERENTIAL/PLATELET
Basophils Absolute: 0 10*3/uL (ref 0.0–0.2)
Basos: 0 %
EOS (ABSOLUTE): 0.3 10*3/uL (ref 0.0–0.4)
Eos: 4 %
Hematocrit: 40.6 % (ref 37.5–51.0)
Hemoglobin: 13.7 g/dL (ref 13.0–17.7)
Immature Grans (Abs): 0 10*3/uL (ref 0.0–0.1)
Immature Granulocytes: 0 %
Lymphocytes Absolute: 1.6 10*3/uL (ref 0.7–3.1)
Lymphs: 24 %
MCH: 30.7 pg (ref 26.6–33.0)
MCHC: 33.7 g/dL (ref 31.5–35.7)
MCV: 91 fL (ref 79–97)
Monocytes Absolute: 0.5 10*3/uL (ref 0.1–0.9)
Monocytes: 8 %
Neutrophils Absolute: 4.2 10*3/uL (ref 1.4–7.0)
Neutrophils: 64 %
Platelets: 291 10*3/uL (ref 150–450)
RBC: 4.46 x10E6/uL (ref 4.14–5.80)
RDW: 12.2 % (ref 11.6–15.4)
WBC: 6.7 10*3/uL (ref 3.4–10.8)

## 2022-12-15 LAB — CMP14+EGFR
ALT: 37 IU/L (ref 0–44)
AST: 27 IU/L (ref 0–40)
Albumin/Globulin Ratio: 1.8 (ref 1.2–2.2)
Albumin: 4.2 g/dL (ref 3.8–4.9)
Alkaline Phosphatase: 84 IU/L (ref 44–121)
BUN/Creatinine Ratio: 12 (ref 9–20)
BUN: 16 mg/dL (ref 6–24)
Bilirubin Total: 0.9 mg/dL (ref 0.0–1.2)
CO2: 21 mmol/L (ref 20–29)
Calcium: 8.9 mg/dL (ref 8.7–10.2)
Chloride: 103 mmol/L (ref 96–106)
Creatinine, Ser: 1.3 mg/dL — ABNORMAL HIGH (ref 0.76–1.27)
Globulin, Total: 2.4 g/dL (ref 1.5–4.5)
Glucose: 119 mg/dL — ABNORMAL HIGH (ref 70–99)
Potassium: 4.3 mmol/L (ref 3.5–5.2)
Sodium: 139 mmol/L (ref 134–144)
Total Protein: 6.6 g/dL (ref 6.0–8.5)
eGFR: 64 mL/min/{1.73_m2} (ref >59)

## 2022-12-15 LAB — CEA: CEA: 1.2 ng/mL (ref 0.0–4.7)

## 2022-12-15 LAB — LIPID PANEL
Chol/HDL Ratio: 2.7 ratio (ref 0.0–5.0)
Cholesterol, Total: 134 mg/dL (ref 100–199)
HDL: 50 mg/dL (ref >39)
LDL Chol Calc (NIH): 54 mg/dL (ref 0–99)
Triglycerides: 181 mg/dL — ABNORMAL HIGH (ref 0–149)
VLDL Cholesterol Cal: 30 mg/dL (ref 5–40)

## 2022-12-15 LAB — PSA: Prostate Specific Ag, Serum: 1.2 ng/mL (ref 0.0–4.0)

## 2022-12-22 ENCOUNTER — Ambulatory Visit: Payer: BC Managed Care – PPO | Admitting: Family Medicine

## 2022-12-28 ENCOUNTER — Ambulatory Visit: Payer: BC Managed Care – PPO | Admitting: Family Medicine

## 2022-12-28 ENCOUNTER — Encounter: Payer: Self-pay | Admitting: Family Medicine

## 2022-12-28 VITALS — BP 122/80 | HR 72 | Temp 97.7°F | Ht 66.75 in | Wt 204.0 lb

## 2022-12-28 DIAGNOSIS — I10 Essential (primary) hypertension: Secondary | ICD-10-CM

## 2022-12-28 DIAGNOSIS — R7989 Other specified abnormal findings of blood chemistry: Secondary | ICD-10-CM | POA: Diagnosis not present

## 2022-12-28 DIAGNOSIS — E782 Mixed hyperlipidemia: Secondary | ICD-10-CM | POA: Diagnosis not present

## 2022-12-28 MED ORDER — PREDNISONE 10 MG PO TABS: ORAL_TABLET | ORAL | 0 refills | Status: DC

## 2022-12-28 MED ORDER — ROSUVASTATIN CALCIUM 10 MG PO TABS: 10.0000 mg | ORAL_TABLET | Freq: Every day | ORAL | 3 refills | Status: DC

## 2022-12-28 NOTE — Assessment & Plan Note (Signed)
Repeat BMP ordered to reassess.

## 2022-12-28 NOTE — Patient Instructions (Signed)
Continue your medication.  Lab in the next couple of weeks (not fasting).  Follow up in 6 months.

## 2022-12-28 NOTE — Progress Notes (Signed)
Subjective:  Patient ID: Scott Meyers, male    DOB: 08-17-1965  Age: 57 y.o. MRN: [id number]  CC: Chief Complaint  Patient presents with   Follow-up    HPI:  57 year old male with hypertension, hyperlipidemia, history of colon cancer, history of gout presents for follow-up.  Patient reports that overall he is doing well.  Blood pressure is well-controlled.  He is on Avapro.  No reported side effects.  Of note, blood work recently via mildly elevated creatinine.  This is a new finding for him.  Lipids stable on Crestor.  He is tolerating well.  LDL was 54.  Patient has a history of colon cancer.  CEA was normal.  Patient does note some swelling to the lateral left ankle.  No significant pain.  No recent injury.  Patient Active Problem List   Diagnosis Date Noted   Elevated serum creatinine 12/28/2022   History of gout 07/31/2021   History of colon cancer 10/07/2017   Prediabetes 11/27/2016   Essential hypertension 11/27/2016   Hyperlipidemia 10/17/2012    Social Hx   Social History   Socioeconomic History   Marital status: Married    Spouse name: Not on file   Number of children: Not on file   Years of education: Not on file   Highest education level: Not on file  Occupational History   Not on file  Tobacco Use   Smoking status: Never   Smokeless tobacco: Current    Types: Snuff  Vaping Use   Vaping Use: Never used  Substance and Sexual Activity   Alcohol use: Yes    Alcohol/week: 56.0 standard drinks of alcohol    Types: 56 Cans of beer per week    Comment: 6 pk daily   Drug use: No   Sexual activity: Not on file  Other Topics Concern   Not on file  Social History Narrative   Not on file   Social Determinants of Health   Financial Resource Strain: Not on file  Food Insecurity: Not on file  Transportation Needs: Not on file  Physical Activity: Not on file  Stress: Not on file  Social Connections: Not on file    Review of Systems Per  HPI  Objective:  BP 122/80   Pulse 72   Temp 97.7 F (36.5 C)   Ht 5' 6.75" (1.695 m)   Wt 204 lb (92.5 kg)   SpO2 96%   BMI 32.19 kg/m      12/28/2022    9:09 AM 01/28/2022    8:22 AM 07/31/2021    8:51 AM  BP/Weight  Systolic BP 122 127 132  Diastolic BP 80 82 90  Wt. (Lbs) 204 201.6 204.2  BMI 32.19 kg/m2 31.81 kg/m2 32.22 kg/m2    Physical Exam Vitals and nursing note reviewed.  Constitutional:      General: He is not in acute distress.    Appearance: Normal appearance.  HENT:     Head: Normocephalic and atraumatic.  Eyes:     General:        Right eye: No discharge.        Left eye: No discharge.     Conjunctiva/sclera: Conjunctivae normal.  Cardiovascular:     Rate and Rhythm: Normal rate and regular rhythm.  Pulmonary:     Effort: Pulmonary effort is normal.     Breath sounds: Normal breath sounds. No wheezing, rhonchi or rales.  Musculoskeletal:     Comments: Mild soft tissue swelling over  the left lateral malleolus.  No tenderness to palpation.  Neurological:     Mental Status: He is alert.  Psychiatric:        Mood and Affect: Mood normal.        Behavior: Behavior normal.     Lab Results  Component Value Date   WBC 6.7 12/14/2022   HGB 13.7 12/14/2022   HCT 40.6 12/14/2022   PLT 291 12/14/2022   GLUCOSE 119 (H) 12/14/2022   CHOL 134 12/14/2022   TRIG 181 (H) 12/14/2022   HDL 50 12/14/2022   LDLCALC 54 12/14/2022   ALT 37 12/14/2022   AST 27 12/14/2022   NA 139 12/14/2022   K 4.3 12/14/2022   CL 103 12/14/2022   CREATININE 1.30 (H) 12/14/2022   BUN 16 12/14/2022   CO2 21 12/14/2022   PSA 1.11 05/12/2014   HGBA1C 5.6 11/12/2014     Assessment & Plan:   Problem List Items Addressed This Visit       Cardiovascular and Mediastinum   Essential hypertension - Primary    Stable.  Continue Avapro.      Relevant Medications   rosuvastatin (CRESTOR) 10 MG tablet     Other   Hyperlipidemia    Stable.  Continue Crestor.       Relevant Medications   rosuvastatin (CRESTOR) 10 MG tablet   Elevated serum creatinine    Repeat BMP ordered to reassess.      Relevant Orders   Basic Metabolic Panel    Meds ordered this encounter  Medications   predniSONE (DELTASONE) 10 MG tablet    Sig: 50 mg daily x 3 days, then 40 mg daily x 3 days, then 30 mg daily x 3 days, then 20 mg daily x 3 days, then 10 mg daily x 3 days.    Dispense:  45 tablet    Refill:  0   rosuvastatin (CRESTOR) 10 MG tablet    Sig: Take 1 tablet (10 mg total) by mouth daily.    Dispense:  90 tablet    Refill:  3    Follow-up:  Return in about 6 months (around 06/29/2023) for HTN follow up.  Everlene Other DO [facility]

## 2022-12-28 NOTE — Assessment & Plan Note (Signed)
Stable. Continue Crestor. 

## 2022-12-28 NOTE — Assessment & Plan Note (Signed)
Stable.  Continue Avapro.

## 2023-01-02 LAB — BASIC METABOLIC PANEL
BUN/Creatinine Ratio: 12 (ref 9–20)
BUN: 13 mg/dL (ref 6–24)
CO2: 25 mmol/L (ref 20–29)
Calcium: 8.8 mg/dL (ref 8.7–10.2)
Chloride: 103 mmol/L (ref 96–106)
Creatinine, Ser: 1.12 mg/dL (ref 0.76–1.27)
Glucose: 143 mg/dL — ABNORMAL HIGH (ref 70–99)
Potassium: 4.3 mmol/L (ref 3.5–5.2)
Sodium: 141 mmol/L (ref 134–144)
eGFR: 77 mL/min/{1.73_m2} (ref >59)

## 2023-04-12 ENCOUNTER — Other Ambulatory Visit: Payer: Self-pay | Admitting: Family Medicine

## 2023-06-14 ENCOUNTER — Other Ambulatory Visit: Payer: Self-pay | Admitting: Family Medicine

## 2023-06-14 ENCOUNTER — Telehealth: Payer: Self-pay

## 2023-06-14 DIAGNOSIS — Z85038 Personal history of other malignant neoplasm of large intestine: Secondary | ICD-10-CM

## 2023-06-14 DIAGNOSIS — R7303 Prediabetes: Secondary | ICD-10-CM

## 2023-06-14 DIAGNOSIS — I10 Essential (primary) hypertension: Secondary | ICD-10-CM

## 2023-06-14 DIAGNOSIS — E782 Mixed hyperlipidemia: Secondary | ICD-10-CM

## 2023-06-14 NOTE — Telephone Encounter (Signed)
Copied from CRM [id number]. Topic: Clinical - Request for Lab/Test Order >> Jun 14, 2023  8:54 AM Scott Meyers wrote: PT is requesting a order for his blood work prior to his appointment on Dec 17 with Dr.Cook-

## 2023-06-15 NOTE — Telephone Encounter (Signed)
Tommie Sams, DO     Lab orders are in.

## 2023-06-17 LAB — CMP14+EGFR
ALT: 36 [IU]/L (ref 0–44)
AST: 24 [IU]/L (ref 0–40)
Albumin: 4.4 g/dL (ref 3.8–4.9)
Alkaline Phosphatase: 84 [IU]/L (ref 44–121)
BUN/Creatinine Ratio: 12 (ref 9–20)
BUN: 12 mg/dL (ref 6–24)
Bilirubin Total: 0.7 mg/dL (ref 0.0–1.2)
CO2: 24 mmol/L (ref 20–29)
Calcium: 9.1 mg/dL (ref 8.7–10.2)
Chloride: 101 mmol/L (ref 96–106)
Creatinine, Ser: 1.02 mg/dL (ref 0.76–1.27)
Globulin, Total: 2.1 g/dL (ref 1.5–4.5)
Glucose: 114 mg/dL — ABNORMAL HIGH (ref 70–99)
Potassium: 4.3 mmol/L (ref 3.5–5.2)
Sodium: 140 mmol/L (ref 134–144)
Total Protein: 6.5 g/dL (ref 6.0–8.5)
eGFR: 86 mL/min/{1.73_m2} (ref >59)

## 2023-06-17 LAB — HEMOGLOBIN A1C
Est. average glucose Bld gHb Est-mCnc: 134 mg/dL
Hgb A1c MFr Bld: 6.3 % — ABNORMAL HIGH (ref 4.8–5.6)

## 2023-06-17 LAB — CEA: CEA: 1.5 ng/mL (ref 0.0–4.7)

## 2023-06-17 LAB — CBC
Hematocrit: 44.7 % (ref 37.5–51.0)
Hemoglobin: 15 g/dL (ref 13.0–17.7)
MCH: 30.9 pg (ref 26.6–33.0)
MCHC: 33.6 g/dL (ref 31.5–35.7)
MCV: 92 fL (ref 79–97)
Platelets: 307 10*3/uL (ref 150–450)
RBC: 4.85 x10E6/uL (ref 4.14–5.80)
RDW: 12 % (ref 11.6–15.4)
WBC: 5.7 10*3/uL (ref 3.4–10.8)

## 2023-06-17 LAB — LIPID PANEL
Chol/HDL Ratio: 2.9 {ratio} (ref 0.0–5.0)
Cholesterol, Total: 167 mg/dL (ref 100–199)
HDL: 57 mg/dL (ref >39)
LDL Chol Calc (NIH): 80 mg/dL (ref 0–99)
Triglycerides: 176 mg/dL — ABNORMAL HIGH (ref 0–149)
VLDL Cholesterol Cal: 30 mg/dL (ref 5–40)

## 2023-06-29 ENCOUNTER — Ambulatory Visit: Payer: BC Managed Care – PPO | Admitting: Family Medicine

## 2023-06-29 VITALS — BP 108/68 | HR 97 | Temp 97.9°F | Ht 66.75 in | Wt 213.0 lb

## 2023-06-29 DIAGNOSIS — I10 Essential (primary) hypertension: Secondary | ICD-10-CM | POA: Diagnosis not present

## 2023-06-29 DIAGNOSIS — Z8739 Personal history of other diseases of the musculoskeletal system and connective tissue: Secondary | ICD-10-CM

## 2023-06-29 DIAGNOSIS — E782 Mixed hyperlipidemia: Secondary | ICD-10-CM | POA: Diagnosis not present

## 2023-06-29 MED ORDER — PREDNISONE 10 MG PO TABS: ORAL_TABLET | ORAL | 0 refills | Status: DC

## 2023-06-29 NOTE — Assessment & Plan Note (Signed)
Stable.  Continue Avapro.

## 2023-06-29 NOTE — Assessment & Plan Note (Signed)
Stable. Continue Crestor. 

## 2023-06-29 NOTE — Assessment & Plan Note (Signed)
Currently doing well.  Prednisone if needed for flare.

## 2023-06-29 NOTE — Patient Instructions (Addendum)
Continue your medications.  Watch diet.  Follow up in 6 months.  Take care  Dr. Adriana Simas

## 2023-06-29 NOTE — Progress Notes (Signed)
Subjective:  Patient ID: Scott Meyers, male    DOB: 1965-12-23  Age: 57 y.o. MRN: [id number]  CC:   Chief Complaint  Patient presents with   Hypertension   left foot pain     Request for prednisone refill    HPI:  57 year old male presents for follow-up.  Hypertension well-controlled on Avapro.  Lipids stable fairly well-controlled on Crestor.  Patient has a history of gout and would like to have prednisone on hand in case he has a flare.  He is currently asymptomatic and feeling well.  Patient due for flu vaccine.  Declines today.  Patient Active Problem List   Diagnosis Date Noted   History of gout 07/31/2021   History of colon cancer 10/07/2017   Prediabetes 11/27/2016   Essential hypertension 11/27/2016   Hyperlipidemia 10/17/2012    Social Hx   Social History   Socioeconomic History   Marital status: Married    Spouse name: Not on file   Number of children: Not on file   Years of education: Not on file   Highest education level: Not on file  Occupational History   Not on file  Tobacco Use   Smoking status: Never   Smokeless tobacco: Current    Types: Snuff  Vaping Use   Vaping status: Never Used  Substance and Sexual Activity   Alcohol use: Yes    Alcohol/week: 56.0 standard drinks of alcohol    Types: 56 Cans of beer per week    Comment: 6 pk daily   Drug use: No   Sexual activity: Not on file  Other Topics Concern   Not on file  Social History Narrative   Not on file   Social Drivers of Health   Financial Resource Strain: Not on file  Food Insecurity: Not on file  Transportation Needs: Not on file  Physical Activity: Not on file  Stress: Not on file  Social Connections: Not on file    Review of Systems  Respiratory: Negative.    Cardiovascular: Negative.    Objective:  BP 108/68   Pulse 97   Temp 97.9 F (36.6 C)   Ht 5' 6.75" (1.695 m)   Wt 213 lb (96.6 kg)   SpO2 96%   BMI 33.61 kg/m      06/29/2023    8:33 AM  12/28/2022    9:09 AM 01/28/2022    8:22 AM  BP/Weight  Systolic BP 108 122 127  Diastolic BP 68 80 82  Wt. (Lbs) 213 204 201.6  BMI 33.61 kg/m2 32.19 kg/m2 31.81 kg/m2    Physical Exam Vitals and nursing note reviewed.  Constitutional:      General: He is not in acute distress.    Appearance: Normal appearance.  HENT:     Head: Normocephalic and atraumatic.  Eyes:     General:        Right eye: No discharge.        Left eye: No discharge.     Conjunctiva/sclera: Conjunctivae normal.  Cardiovascular:     Rate and Rhythm: Normal rate and regular rhythm.  Pulmonary:     Effort: Pulmonary effort is normal.     Breath sounds: Normal breath sounds. No wheezing, rhonchi or rales.  Neurological:     Mental Status: He is alert.  Psychiatric:        Mood and Affect: Mood normal.        Behavior: Behavior normal.     Lab Results  Component Value Date   WBC 5.7 06/16/2023   HGB 15.0 06/16/2023   HCT 44.7 06/16/2023   PLT 307 06/16/2023   GLUCOSE 114 (H) 06/16/2023   CHOL 167 06/16/2023   TRIG 176 (H) 06/16/2023   HDL 57 06/16/2023   LDLCALC 80 06/16/2023   ALT 36 06/16/2023   AST 24 06/16/2023   NA 140 06/16/2023   K 4.3 06/16/2023   CL 101 06/16/2023   CREATININE 1.02 06/16/2023   BUN 12 06/16/2023   CO2 24 06/16/2023   PSA 1.11 05/12/2014   HGBA1C 6.3 (H) 06/16/2023     Assessment & Plan:   Problem List Items Addressed This Visit       Cardiovascular and Mediastinum   Essential hypertension - Primary   Stable.  Continue Avapro.        Other   History of gout   Currently doing well.  Prednisone if needed for flare.      Hyperlipidemia   Stable.  Continue Crestor.       Meds ordered this encounter  Medications   predniSONE (DELTASONE) 10 MG tablet    Sig: 50 mg daily x 2 days, then 40 mg daily x 2 days, then 30 mg daily x 2 days, then 20 mg daily x 2 days, then 10 mg daily x 2 days.    Dispense:  30 tablet    Refill:  0    Follow-up:  6  months  Delia Sitar Adriana Simas DO [facility]

## 2023-08-26 ENCOUNTER — Other Ambulatory Visit: Payer: Self-pay | Admitting: Family Medicine

## 2023-08-26 NOTE — Telephone Encounter (Signed)
Copied from CRM [id number]. Topic: Clinical - Medication Refill >> Aug 26, 2023 10:27 AM Dollene Primrose wrote: Most Recent Primary Care Visit:  Provider: Tommie Sams  Department: [facility]  Visit Type: OFFICE VISIT  Date: 06/29/2023  Medication: predniSONE (DELTASONE) 10 MG tablet  Has the patient contacted their pharmacy? no (Agent: If no, request that the patient contact the pharmacy for the refill. If patient does not wish to contact the pharmacy document the reason why and proceed with request.) (Agent: If yes, when and what did the pharmacy advise?)  Is this the correct pharmacy for this prescription? yes If no, delete pharmacy and type the correct one.  This is the patient's preferred pharmacy:  [id number] - [place], [place] - [address] [place] [address] Phone: [phone] Fax: [phone]   Has the prescription been filled recently? no  Is the patient out of the medication? yes  Has the patient been seen for an appointment in the last year OR does the patient have an upcoming appointment? yes  Can we respond through MyChart? yes  Agent: Please be advised that Rx refills may take up to 3 business days. We ask that you follow-up with your pharmacy.

## 2023-08-31 ENCOUNTER — Other Ambulatory Visit: Payer: Self-pay | Admitting: Family Medicine

## 2023-08-31 NOTE — Telephone Encounter (Signed)
 Copied from CRM [id number]. Topic: Clinical - Medication Refill >> Aug 31, 2023  1:26 PM Eunice Blase wrote: Most Recent Primary Care Visit:  Provider: Tommie Sams  Department: [facility]  Visit Type: OFFICE VISIT  Date: 06/29/2023  Medication: predniSONE (DELTASONE) 10 MG tablet  Has the patient contacted their pharmacy? Yes (Agent: If no, request that the patient contact the pharmacy for the refill. If patient does not wish to contact the pharmacy document the reason why and proceed with request.) (Agent: If yes, when and what did the pharmacy advise?)  Is this the correct pharmacy for this prescription? Yes If no, delete pharmacy and type the correct one.  This is the patient's preferred pharmacy:  [id number] - [place], [place] - [address] [place] [address] Phone: [phone] Fax: [phone]   Has the prescription been filled recently? Yes  Is the patient out of the medication? Yes  Has the patient been seen for an appointment in the last year OR does the patient have an upcoming appointment? Yes  Can we respond through MyChart? Yes  Agent: Please be advised that Rx refills may take up to 3 business days. We ask that you follow-up with your pharmacy.

## 2023-09-02 ENCOUNTER — Telehealth (INDEPENDENT_AMBULATORY_CARE_PROVIDER_SITE_OTHER): Payer: Self-pay | Admitting: Family Medicine

## 2023-09-02 DIAGNOSIS — M79673 Pain in unspecified foot: Secondary | ICD-10-CM | POA: Diagnosis not present

## 2023-09-02 MED ORDER — INDOMETHACIN ER 75 MG PO CPCR: 75.0000 mg | ORAL_CAPSULE | Freq: Two times a day (BID) | ORAL | 1 refills | Status: DC | PRN

## 2023-09-02 NOTE — Progress Notes (Signed)
 Virtual Visit via Video Note  I connected with Scott Meyers on 09/02/23 at  2:50 PM EST by a video enabled telemedicine application and verified that I am speaking with the correct person using two identifiers.  Location: Patient: Home Provider: Office   I discussed the limitations of evaluation and management by telemedicine and the availability of in person appointments. The patient expressed understanding and agreed to proceed.  History of Present Illness: 58 year old male presents for evaluation of foot pain.  Patient has a history of gout.  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  He recently took some prednisone with improvement.  However, he continues to have some difficulty with the left foot.  He sent in a prescription request for prednisone.  We will discuss treatment options today.   Observations/Objective: General: Well-appearing male in no acute distress.  Speaking in full sentences. Respiratory: No respiratory distress.  Assessment and Plan: Foot pain History of gout. Indomethacin as directed.  Meds ordered this encounter  Medications   indomethacin (INDOCIN SR) 75 MG CR capsule    Sig: Take 1 capsule (75 mg total) by mouth 2 (two) times daily as needed for moderate pain (pain score 4-6). Take with food.    Dispense:  60 capsule    Refill:  1    Follow Up Instructions:    I discussed the assessment and treatment plan with the patient. The patient was provided an opportunity to ask questions and all were answered. The patient agreed with the plan and demonstrated an understanding of the instructions.   The patient was advised to call back or seek an in-person evaluation if the symptoms worsen or if the condition fails to improve as anticipated.  I provided 5 minutes of non-face-to-face time during this encounter.   Tommie Sams, DO

## 2023-11-05 ENCOUNTER — Other Ambulatory Visit: Payer: Self-pay | Admitting: Family Medicine

## 2023-12-28 ENCOUNTER — Ambulatory Visit: Payer: BC Managed Care – PPO | Admitting: Family Medicine

## 2023-12-28 VITALS — BP 112/76 | HR 79 | Temp 98.4°F | Ht 66.7 in | Wt 212.0 lb

## 2023-12-28 DIAGNOSIS — R0989 Other specified symptoms and signs involving the circulatory and respiratory systems: Secondary | ICD-10-CM | POA: Diagnosis not present

## 2023-12-28 DIAGNOSIS — E782 Mixed hyperlipidemia: Secondary | ICD-10-CM | POA: Diagnosis not present

## 2023-12-28 DIAGNOSIS — I10 Essential (primary) hypertension: Secondary | ICD-10-CM | POA: Diagnosis not present

## 2023-12-28 MED ORDER — IRBESARTAN 300 MG PO TABS: 300.0000 mg | ORAL_TABLET | Freq: Every day | ORAL | 3 refills | Status: DC

## 2023-12-28 NOTE — Assessment & Plan Note (Signed)
 Exam unremarkable.  Patient requesting referral to ENT.  Placing referral.

## 2023-12-28 NOTE — Progress Notes (Signed)
 Subjective:  Patient ID: Scott Meyers, male    DOB: [birth year]  Age: 58 y.o. MRN: [id number]  CC: Follow-up  HPI:  58 year old male with the below mentioned medical problems presents for follow-up.  Hypertension stable on Avapro .  Lipids have been fairly well-controlled on Crestor .  No recent gout flares.  Patient reports that he is having 1 current issue.  He reports that he has a tightness in his throat.  He states that it occurs at rest and also with swallowing.  He is not having any difficulty swallowing.  Endorses good appetite.  Patient states that he has a family member who had laryngeal cancer.  He is concerned about this.  No reports of hoarseness.  He states that he does clear his throat often.  Patient Active Problem List   Diagnosis Date Noted   Throat tightness 12/28/2023   History of gout 07/31/2021   History of colon cancer 10/07/2017   Prediabetes 11/27/2016   Essential hypertension 11/27/2016   Hyperlipidemia 10/17/2012    Social Hx   Social History   Socioeconomic History   Marital status: Married    Spouse name: Not on file   Number of children: Not on file   Years of education: Not on file   Highest education level: Not on file  Occupational History   Not on file  Tobacco Use   Smoking status: Never   Smokeless tobacco: Current    Types: Snuff  Vaping Use   Vaping status: Never Used  Substance and Sexual Activity   Alcohol use: Yes    Alcohol/week: 56.0 standard drinks of alcohol    Types: 56 Cans of beer per week    Comment: 6 pk daily   Drug use: No   Sexual activity: Not on file  Other Topics Concern   Not on file  Social History Narrative   Not on file   Social Drivers of Health   Financial Resource Strain: Not on file  Food Insecurity: Not on file  Transportation Needs: Not on file  Physical Activity: Not on file  Stress: Not on file  Social Connections: Not on file    Review of Systems Per HPI  Objective:  BP 112/76    Pulse 79   Temp 98.4 F (36.9 C)   Ht 5' 6.7 (1.694 m)   Wt 212 lb (96.2 kg)   SpO2 98%   BMI 33.50 kg/m      12/28/2023    8:32 AM 06/29/2023    8:33 AM 12/28/2022    9:09 AM  BP/Weight  Systolic BP 112 108 122  Diastolic BP 76 68 80  Wt. (Lbs) 212 213 204  BMI 33.5 kg/m2 33.61 kg/m2 32.19 kg/m2    Physical Exam Vitals and nursing note reviewed.  Constitutional:      General: He is not in acute distress.    Appearance: Normal appearance.  HENT:     Head: Normocephalic and atraumatic.   Eyes:     General:        Right eye: No discharge.        Left eye: No discharge.     Conjunctiva/sclera: Conjunctivae normal.    Cardiovascular:     Rate and Rhythm: Normal rate and regular rhythm.  Pulmonary:     Effort: Pulmonary effort is normal.     Breath sounds: Normal breath sounds. No wheezing, rhonchi or rales.   Musculoskeletal:     Cervical back: Neck supple. No tenderness.  Neurological:     Mental Status: He is alert.   Psychiatric:        Mood and Affect: Mood normal.        Behavior: Behavior normal.     Lab Results  Component Value Date   WBC 5.7 06/16/2023   HGB 15.0 06/16/2023   HCT 44.7 06/16/2023   PLT 307 06/16/2023   GLUCOSE 114 (H) 06/16/2023   CHOL 167 06/16/2023   TRIG 176 (H) 06/16/2023   HDL 57 06/16/2023   LDLCALC 80 06/16/2023   ALT 36 06/16/2023   AST 24 06/16/2023   NA 140 06/16/2023   K 4.3 06/16/2023   CL 101 06/16/2023   CREATININE 1.02 06/16/2023   BUN 12 06/16/2023   CO2 24 06/16/2023   PSA 1.11 05/12/2014   HGBA1C 6.3 (H) 06/16/2023     Assessment & Plan:  Essential hypertension Assessment & Plan: Stable. Continue Avapro .  Orders: -     Irbesartan ; Take 1 tablet (300 mg total) by mouth daily.  Dispense: 90 tablet; Refill: 3  Mixed hyperlipidemia Assessment & Plan: Stable. Continue Crestor .   Throat tightness Assessment & Plan: Exam unremarkable.  Patient requesting referral to ENT.  Placing  referral.  Orders: -     Ambulatory referral to ENT    Follow-up:  6 months  Jeremian Whitby Debrah Fan DO [facility]

## 2023-12-28 NOTE — Assessment & Plan Note (Signed)
Stable.  Continue Avapro.

## 2023-12-28 NOTE — Patient Instructions (Signed)
Follow up in 6 months.  Take care  Dr. Wren Gallaga  

## 2023-12-28 NOTE — Assessment & Plan Note (Signed)
 Stable. Continue Crestor.

## 2024-03-24 ENCOUNTER — Other Ambulatory Visit: Payer: Self-pay | Admitting: Family Medicine

## 2024-03-27 ENCOUNTER — Encounter (INDEPENDENT_AMBULATORY_CARE_PROVIDER_SITE_OTHER): Payer: Self-pay | Admitting: Otolaryngology

## 2024-03-27 ENCOUNTER — Ambulatory Visit (INDEPENDENT_AMBULATORY_CARE_PROVIDER_SITE_OTHER): Admitting: Otolaryngology

## 2024-03-27 VITALS — BP 151/85 | HR 77

## 2024-03-27 DIAGNOSIS — R0982 Postnasal drip: Secondary | ICD-10-CM

## 2024-03-27 DIAGNOSIS — K219 Gastro-esophageal reflux disease without esophagitis: Secondary | ICD-10-CM

## 2024-03-27 DIAGNOSIS — R09A2 Foreign body sensation, throat: Secondary | ICD-10-CM | POA: Diagnosis not present

## 2024-03-27 DIAGNOSIS — R0981 Nasal congestion: Secondary | ICD-10-CM | POA: Diagnosis not present

## 2024-03-27 DIAGNOSIS — J3089 Other allergic rhinitis: Secondary | ICD-10-CM

## 2024-03-27 MED ORDER — FLUTICASONE PROPIONATE 50 MCG/ACT NA SUSP: 2.0000 | Freq: Every day | NASAL | 6 refills | Status: AC

## 2024-03-27 MED ORDER — LEVOCETIRIZINE DIHYDROCHLORIDE 5 MG PO TABS: 5.0000 mg | ORAL_TABLET | Freq: Every evening | ORAL | 3 refills | Status: AC

## 2024-03-27 MED ORDER — FAMOTIDINE 20 MG PO TABS
20.0000 mg | ORAL_TABLET | Freq: Two times a day (BID) | ORAL | 3 refills | Status: DC
Start: 2024-03-27 — End: 2024-04-03

## 2024-03-27 NOTE — Progress Notes (Signed)
 Patient forgot to take BP medication.

## 2024-03-27 NOTE — Progress Notes (Signed)
 ENT CONSULT:  Reason for Consult: globus sensation and throat clearing    HPI: Discussed the use of AI scribe software for clinical note transcription with the patient, who gave verbal consent to proceed.  History of Present Illness Scott Meyers is a 58 year old male who presents with chronic throat clearing and globus sensation.  He experiences a sensation of needing to clear his throat frequently. He denies pain or difficulty with swallowing and voice changes. He has been coughing a little bit recently.  He mentions having occasional acid reflux but has not been on any medications for it.  Family history is notable for an uncle who had throat cancer. No smoking hx. No voice changes. Denies allergies.   Records Reviewed:  PCP visit 08/06/20 Dr Waddell  Pt used to see Dr. Golda.  Has h/o colon cancer.  They check [facility] yearly. Not having to see them yearly. Had colon cancer 2018. Found 2 polyps were cancerous. No chemotherapy.   Past Medical History:  Diagnosis Date   Colon cancer (HCC) 09/23/2016   Gout    Hyperlipidemia    Hypertension     Past Surgical History:  Procedure Laterality Date   APPENDECTOMY     COLONOSCOPY N/A 09/10/2016   Procedure: COLONOSCOPY;  Surgeon: Claudis RAYMOND Golda, MD;  Location: [facility];  Service: Endoscopy;  Laterality: N/A;  830   COLONOSCOPY N/A 12/16/2017   Procedure: COLONOSCOPY;  Surgeon: Golda Claudis RAYMOND, MD;  Location: [facility];  Service: Endoscopy;  Laterality: N/A;  915   COLONOSCOPY N/A 02/13/2021   Procedure: COLONOSCOPY;  Surgeon: Golda Claudis RAYMOND, MD;  Location: [facility];  Service: Endoscopy;  Laterality: N/A;  8:20   PARTIAL COLECTOMY N/A 09/23/2016   Procedure: PARTIAL COLECTOMY;  Surgeon: Oneil Budge, MD;  Location: [facility];  Service: General;  Laterality: N/A;   POLYPECTOMY  09/10/2016   Procedure: POLYPECTOMY;  Surgeon: Claudis RAYMOND Golda, MD;  Location: [facility];  Service: Endoscopy;;  colon   POLYPECTOMY  02/13/2021    Procedure: POLYPECTOMY;  Surgeon: Golda Claudis RAYMOND, MD;  Location: [facility];  Service: Endoscopy;;  ascending colon    Family History  Problem Relation Age of Onset   Cancer Mother    Hypertension Father    Cancer Father    Colon cancer Neg Hx     Social History:  reports that he has never smoked. His smokeless tobacco use includes snuff. He reports current alcohol use of about 56.0 standard drinks of alcohol per week. He reports that he does not use drugs.  Allergies: No Known Allergies  Medications: I have reviewed the patient's current medications.  The PMH, PSH, Medications, Allergies, and SH were reviewed and updated.  ROS: Constitutional: Negative for fever, weight loss and weight gain. Cardiovascular: Negative for chest pain and dyspnea on exertion. Respiratory: Is not experiencing shortness of breath at rest. Gastrointestinal: Negative for nausea and vomiting. Neurological: Negative for headaches. Psychiatric: The patient is not nervous/anxious  Blood pressure (!) 151/85, pulse 77, SpO2 94%. There is no height or weight on file to calculate BMI.  PHYSICAL EXAM:  Exam: General: Well-developed, well-nourished Respiratory Respiratory effort: Equal inspiration and expiration without stridor Cardiovascular Peripheral Vascular: Warm extremities with equal color/perfusion Eyes: No nystagmus with equal extraocular motion bilaterally Neuro/Psych/Balance: Patient oriented to person, place, and time; Appropriate mood and affect; Gait is intact with no imbalance; Cranial nerves I-XII are intact Head and Face Inspection: Normocephalic and atraumatic without mass  or lesion Palpation: Facial skeleton intact without bony stepoffs Salivary Glands: No mass or tenderness Facial Strength: Facial motility symmetric and full bilaterally ENT Pinna: External ear intact and fully developed External canal: Canal is patent with intact skin Tympanic Membrane: Clear and  mobile External Nose: No scar or anatomic deformity Internal Nose: Septum is deviated to the left. No polyp, or purulence. Mucosal edema and erythema present.  Bilateral inferior turbinate hypertrophy.  Lips, Teeth, and gums: Mucosa and teeth intact and viable TMJ: No pain to palpation with full mobility Oral cavity/oropharynx: No erythema or exudate, no lesions present Nasopharynx: No mass or lesion with intact mucosa Hypopharynx: Intact mucosa without pooling of secretions Larynx Glottic: Full true vocal cord mobility without lesion or mass Supraglottic: Normal appearing epiglottis and AE folds Interarytenoid Space: Moderate pachydermia&edema Subglottic Space: Patent without lesion or edema Neck Neck and Trachea: Midline trachea without mass or lesion Thyroid: No mass or nodularity Lymphatics: No lymphadenopathy  Procedure: Preoperative diagnosis: globus, chronic throat clearing   Postoperative diagnosis:   Same + GERD LPR  Procedure: Flexible fiberoptic laryngoscopy  Surgeon: Elena Larry, MD  Anesthesia: Topical lidocaine  and Afrin Complications: None Condition is stable throughout exam  Indications and consent:  The patient presents to the clinic with above symptoms. Indirect laryngoscopy view was incomplete. Thus it was recommended that they undergo a flexible fiberoptic laryngoscopy. All of the risks, benefits, and potential complications were reviewed with the patient preoperatively and verbal informed consent was obtained.  Procedure: The patient was seated upright in the clinic. Topical lidocaine  and Afrin were applied to the nasal cavity. After adequate anesthesia had occurred, I then proceeded to pass the flexible telescope into the nasal cavity. The nasal cavity was patent without rhinorrhea or polyp. The nasopharynx was also patent without mass or lesion. The base of tongue was visualized and was normal. There were no signs of pooling of secretions in the piriform  sinuses. The true vocal folds were mobile bilaterally. There were no signs of glottic or supraglottic mucosal lesion or mass. There was moderate interarytenoid pachydermia and post cricoid edema. The telescope was then slowly withdrawn and the patient tolerated the procedure throughout.    Studies Reviewed: CXR 09/21/2016 MPRESSION: Low lung volumes with mild basilar atelectasis.  Assessment/Plan: Encounter Diagnoses  Name Primary?   Globus sensation Yes   Chronic GERD    Chronic nasal congestion    Environmental and seasonal allergies    Post-nasal drip     Assessment and Plan Assessment & Plan Chronic throat clearing and globus sensation Throat clearing likely due to reflux and postnasal drainage. Flexible scope was unremarkable aside from findings c/w GERD LPR and PND  - medical management of GERD and post-nasal drainage as below  GERD LPR - Pepcid  20 mg BID  -  Reflux Gourmet after meals - diet and lifestyle changes to minimize GERD - Refer to [employer] blog for dietary and lifestyle modifications/reflux cook book  Chronic nasal congestion and postnasal drainage symptoms Congestion likely contributing to throat clearing. Chronic nasal congestion and post-nasal drainage Evidence of post-nasal drainage during flexible scope exam today, could be contributing to her sx - trial of Xyzal  5 mg daily and Flonase  2 puffs b/l nares BID - consider nasal saline rinses   Thank you for allowing me to participate in the care of this patient. Please do not hesitate to contact me with any questions or concerns.   Elena Larry, MD Otolaryngology [facility] Phone: [phone] Fax:  [phone]    03/27/2024, 8:50 AM

## 2024-03-27 NOTE — Patient Instructions (Signed)

## 2024-04-03 ENCOUNTER — Other Ambulatory Visit (INDEPENDENT_AMBULATORY_CARE_PROVIDER_SITE_OTHER): Payer: Self-pay | Admitting: Otolaryngology

## 2024-06-13 ENCOUNTER — Telehealth: Payer: Self-pay | Admitting: *Deleted

## 2024-06-13 NOTE — Telephone Encounter (Signed)
 Copied from CRM [id number]. Topic: Clinical - Request for Lab/Test Order >> Jun 13, 2024  2:49 PM Scott Meyers wrote: Reason for CRM: Pt has an upcoming routine visit w PCP and would normally have blood work performed before app. Pt is requesting PCP to send an order for labs. Please advise pt [phone]

## 2024-06-20 NOTE — Telephone Encounter (Signed)
 Copied from CRM [id number]. Topic: Clinical - Lab/Test Results >> Jun 20, 2024  1:55 PM Lonell PEDLAR wrote: Reason for CRM: Patient is requesting labs be called in prior to apt on 12/17 so he can discuss with provider at the time of apt. Patient is specifically requesting lab to check for colon, instead of completing colonoscopy.

## 2024-06-21 ENCOUNTER — Other Ambulatory Visit: Payer: Self-pay | Admitting: Family Medicine

## 2024-06-22 ENCOUNTER — Other Ambulatory Visit: Payer: Self-pay | Admitting: Family Medicine

## 2024-06-22 DIAGNOSIS — I10 Essential (primary) hypertension: Secondary | ICD-10-CM

## 2024-06-22 DIAGNOSIS — Z125 Encounter for screening for malignant neoplasm of prostate: Secondary | ICD-10-CM

## 2024-06-22 DIAGNOSIS — E782 Mixed hyperlipidemia: Secondary | ICD-10-CM

## 2024-06-22 DIAGNOSIS — R7303 Prediabetes: Secondary | ICD-10-CM

## 2024-06-22 DIAGNOSIS — Z85038 Personal history of other malignant neoplasm of large intestine: Secondary | ICD-10-CM

## 2024-06-28 ENCOUNTER — Ambulatory Visit: Admitting: Family Medicine

## 2024-06-28 ENCOUNTER — Encounter: Payer: Self-pay | Admitting: Family Medicine

## 2024-06-28 VITALS — BP 140/89 | HR 86 | Temp 98.4°F | Ht 66.7 in | Wt 218.0 lb

## 2024-06-28 DIAGNOSIS — M109 Gout, unspecified: Secondary | ICD-10-CM | POA: Insufficient documentation

## 2024-06-28 DIAGNOSIS — I10 Essential (primary) hypertension: Secondary | ICD-10-CM | POA: Diagnosis not present

## 2024-06-28 DIAGNOSIS — E782 Mixed hyperlipidemia: Secondary | ICD-10-CM | POA: Diagnosis not present

## 2024-06-28 MED ORDER — INDOMETHACIN ER 75 MG PO CPCR: ORAL_CAPSULE | ORAL | 1 refills | Status: AC

## 2024-06-28 MED ORDER — ROSUVASTATIN CALCIUM 10 MG PO TABS: 10.0000 mg | ORAL_TABLET | Freq: Every day | ORAL | 3 refills | Status: AC

## 2024-06-28 MED ORDER — PREDNISONE 10 MG PO TABS: ORAL_TABLET | ORAL | 0 refills | Status: AC

## 2024-06-28 MED ORDER — IRBESARTAN 300 MG PO TABS: 300.0000 mg | ORAL_TABLET | Freq: Every day | ORAL | 3 refills | Status: AC

## 2024-06-28 NOTE — Patient Instructions (Signed)
 Labs today.  Follow up in 6 months.

## 2024-06-28 NOTE — Assessment & Plan Note (Signed)
 Stable. Continue Avapro . Refilled.

## 2024-06-28 NOTE — Progress Notes (Signed)
 Subjective:  Patient ID: Scott Meyers, male    DOB: May 21, 1966  Age: 58 y.o. MRN: [id number]  CC:   Chief Complaint  Patient presents with   gout pain left foot pain     HPI:  58 year old male presents for follow up.  2 week history of left foot pain (mostly at the L 1st MTP).  Has a history of gout. Out of indomethacin . Needs treatment.   BP fairly well controlled. Needs med refills.  Declines immunizations. Colonoscopy up to date.   Last LDL 80. Tolerating Crestor .  Patient Active Problem List   Diagnosis Date Noted   Acute gout involving toe of left foot 06/28/2024   History of colon cancer 10/07/2017   Prediabetes 11/27/2016   Essential hypertension 11/27/2016   Hyperlipidemia 10/17/2012    Social Hx   Social History   Socioeconomic History   Marital status: Married    Spouse name: Not on file   Number of children: Not on file   Years of education: Not on file   Highest education level: Not on file  Occupational History   Not on file  Tobacco Use   Smoking status: Never   Smokeless tobacco: Current    Types: Snuff  Vaping Use   Vaping status: Never Used  Substance and Sexual Activity   Alcohol use: Yes    Alcohol/week: 56.0 standard drinks of alcohol    Types: 56 Cans of beer per week    Comment: 6 pk daily   Drug use: No   Sexual activity: Not on file  Other Topics Concern   Not on file  Social History Narrative   Not on file   Social Drivers of Health   Tobacco Use: High Risk (06/28/2024)   Patient History    Smoking Tobacco Use: Never    Smokeless Tobacco Use: Current    Passive Exposure: Not on file  Financial Resource Strain: Not on file  Food Insecurity: Not on file  Transportation Needs: Not on file  Physical Activity: Not on file  Stress: Not on file  Social Connections: Not on file  Depression (PHQ2-9): Low Risk (06/28/2024)   Depression (PHQ2-9)    PHQ-2 Score: 0  Alcohol Screen: Not on file  Housing: Not on file   Utilities: Not on file  Health Literacy: Not on file    Review of Systems Per HPI  Objective:  BP (!) 140/89   Pulse 86   Temp 98.4 F (36.9 C)   Ht 5' 6.7 (1.694 m)   Wt 218 lb (98.9 kg)   SpO2 99%   BMI 34.45 kg/m      06/28/2024    8:40 AM 03/27/2024    8:26 AM 12/28/2023    8:32 AM  BP/Weight  Systolic BP 140 151 112  Diastolic BP 89 85 76  Wt. (Lbs) 218  212  BMI 34.45 kg/m2  33.5 kg/m2    Physical Exam Vitals and nursing note reviewed.  Constitutional:      General: He is not in acute distress.    Appearance: Normal appearance.  HENT:     Head: Normocephalic and atraumatic.  Cardiovascular:     Rate and Rhythm: Normal rate and regular rhythm.  Pulmonary:     Effort: Pulmonary effort is normal.     Breath sounds: Normal breath sounds. No wheezing or rales.  Musculoskeletal:     Comments: Left foot - tenderness at the left 1st MTP with mild swelling and  erythema.  Neurological:     Mental Status: He is alert.     Lab Results  Component Value Date   WBC 5.7 06/16/2023   HGB 15.0 06/16/2023   HCT 44.7 06/16/2023   PLT 307 06/16/2023   GLUCOSE 114 (H) 06/16/2023   CHOL 167 06/16/2023   TRIG 176 (H) 06/16/2023   HDL 57 06/16/2023   LDLCALC 80 06/16/2023   ALT 36 06/16/2023   AST 24 06/16/2023   NA 140 06/16/2023   K 4.3 06/16/2023   CL 101 06/16/2023   CREATININE 1.02 06/16/2023   BUN 12 06/16/2023   CO2 24 06/16/2023   PSA 1.11 05/12/2014   HGBA1C 6.3 (H) 06/16/2023     Assessment & Plan:  Essential hypertension Assessment & Plan: Stable. Continue Avapro . Refilled.  Orders: -     Irbesartan ; Take 1 tablet (300 mg total) by mouth daily.  Dispense: 90 tablet; Refill: 3  Acute gout involving toe of left foot, unspecified cause Assessment & Plan: Treating with Prednisone . Indomethacin  refilled to be used with future flares.  Uric acid today.  Orders: -     Indomethacin  ER; TAKE 1 CAPSULE 2 TIMES DAILY AS NEEDED FOR MODERATE PAIN  (PAIN SCORE 4-6). TAKE WITH FOOD.  Dispense: 60 capsule; Refill: 1 -     predniSONE ; 50 mg daily x 3 days, then 40 mg daily x 3 days, then 30 mg daily x 3 days, then 20 mg daily x 3 days, then 10 mg daily x 3 days.  Dispense: 45 tablet; Refill: 0 -     Uric acid  Mixed hyperlipidemia Assessment & Plan: Stable. Crestor  refilled.  Orders: -     Rosuvastatin  Calcium ; Take 1 tablet (10 mg total) by mouth daily.  Dispense: 90 tablet; Refill: 3    Follow-up:  6 months.  Jacqulyn Ahle DO [facility]

## 2024-06-28 NOTE — Assessment & Plan Note (Signed)
 Stable. Crestor  refilled.

## 2024-06-28 NOTE — Assessment & Plan Note (Signed)
 Treating with Prednisone . Indomethacin  refilled to be used with future flares.  Uric acid today.

## 2024-06-29 ENCOUNTER — Ambulatory Visit: Payer: Self-pay | Admitting: Family Medicine

## 2024-06-29 LAB — LIPID PANEL
Chol/HDL Ratio: 3.1 ratio (ref 0.0–5.0)
Cholesterol, Total: 153 mg/dL (ref 100–199)
HDL: 50 mg/dL (ref >39)
LDL Chol Calc (NIH): 69 mg/dL (ref 0–99)
Triglycerides: 205 mg/dL — ABNORMAL HIGH (ref 0–149)
VLDL Cholesterol Cal: 34 mg/dL (ref 5–40)

## 2024-06-29 LAB — CMP14+EGFR
ALT: 48 IU/L — ABNORMAL HIGH (ref 0–44)
AST: 32 IU/L (ref 0–40)
Albumin: 4.5 g/dL (ref 3.8–4.9)
Alkaline Phosphatase: 114 IU/L (ref 47–123)
BUN/Creatinine Ratio: 11 (ref 9–20)
BUN: 12 mg/dL (ref 6–24)
Bilirubin Total: 0.7 mg/dL (ref 0.0–1.2)
CO2: 24 mmol/L (ref 20–29)
Calcium: 9.3 mg/dL (ref 8.7–10.2)
Chloride: 100 mmol/L (ref 96–106)
Creatinine, Ser: 1.13 mg/dL (ref 0.76–1.27)
Globulin, Total: 2.5 g/dL (ref 1.5–4.5)
Glucose: 111 mg/dL — ABNORMAL HIGH (ref 70–99)
Potassium: 5 mmol/L (ref 3.5–5.2)
Sodium: 140 mmol/L (ref 134–144)
Total Protein: 7 g/dL (ref 6.0–8.5)
eGFR: 75 mL/min/1.73 (ref >59)

## 2024-06-29 LAB — CBC
Hematocrit: 45 % (ref 37.5–51.0)
Hemoglobin: 14.9 g/dL (ref 13.0–17.7)
MCH: 30.3 pg (ref 26.6–33.0)
MCHC: 33.1 g/dL (ref 31.5–35.7)
MCV: 92 fL (ref 79–97)
Platelets: 320 x10E3/uL (ref 150–450)
RBC: 4.91 x10E6/uL (ref 4.14–5.80)
RDW: 12.4 % (ref 11.6–15.4)
WBC: 6.6 x10E3/uL (ref 3.4–10.8)

## 2024-06-29 LAB — HEMOGLOBIN A1C
Est. average glucose Bld gHb Est-mCnc: 131 mg/dL
Hgb A1c MFr Bld: 6.2 % — ABNORMAL HIGH (ref 4.8–5.6)

## 2024-06-29 LAB — CEA: CEA: 1.3 ng/mL (ref 0.0–4.7)

## 2024-06-29 LAB — MICROALBUMIN / CREATININE URINE RATIO
Creatinine, Urine: 160.4 mg/dL
Microalb/Creat Ratio: 4 mg/g{creat} (ref 0–29)
Microalbumin, Urine: 5.7 ug/mL

## 2024-06-29 LAB — PSA: Prostate Specific Ag, Serum: 1.5 ng/mL (ref 0.0–4.0)

## 2024-06-29 LAB — URIC ACID: Uric Acid: 7.7 mg/dL (ref 3.8–8.4)

## 2024-12-27 ENCOUNTER — Ambulatory Visit: Admitting: Family Medicine
